# Patient Record
Sex: Female | Born: 1985 | Hispanic: Yes | Smoking: Never smoker
Health system: Southern US, Community
[De-identification: ages and names within clinical notes are randomized; demographics above are authoritative.]

## PROBLEM LIST (undated history)

## (undated) DIAGNOSIS — R51 Headache: Secondary | ICD-10-CM

---

## 2004-10-28 ENCOUNTER — Inpatient Hospital Stay (HOSPITAL_COMMUNITY): Admission: AD | Admit: 2004-10-28 | Discharge: 2004-10-28 | Payer: Self-pay | Admitting: Obstetrics & Gynecology

## 2005-01-01 ENCOUNTER — Inpatient Hospital Stay (HOSPITAL_COMMUNITY): Admission: AD | Admit: 2005-01-01 | Discharge: 2005-01-04 | Payer: Self-pay | Admitting: Obstetrics and Gynecology

## 2006-03-12 IMAGING — US US OB COMP LESS 14 WK
1 series · 14 of 28 positions shown · non-contrast
Comparison: none

CLINICAL DATA: Abnormal vaginal bleeding.   Early pregnancy.
 TRANSABDOMINAL AND TRANSVAGINAL OBSTRETRICAL ULTRASOUND: 
 There is a deformed gestational sac measuring approximately 3 cm in diameter consistent with a gestational age of 8 weeks, 2 days.   There is a tiny yolk sac but no definitive embryo.  Small soft tissue density which could represent an embryo measures 3.7 mm consistent with 6 weeks, 0 days.   There is no cardiac activity however.  There is a small subchorionic hemorrhage.   There is a 1.4 cm hemorrhagic corpus luteum cyst on the otherwise normal right ovary.   Left ovary is normal.  No free fluid.

[Series 1: us ob comp less 14 wk · 0.22mm/px · 14 of 69 slices shown]
[im 3/69]
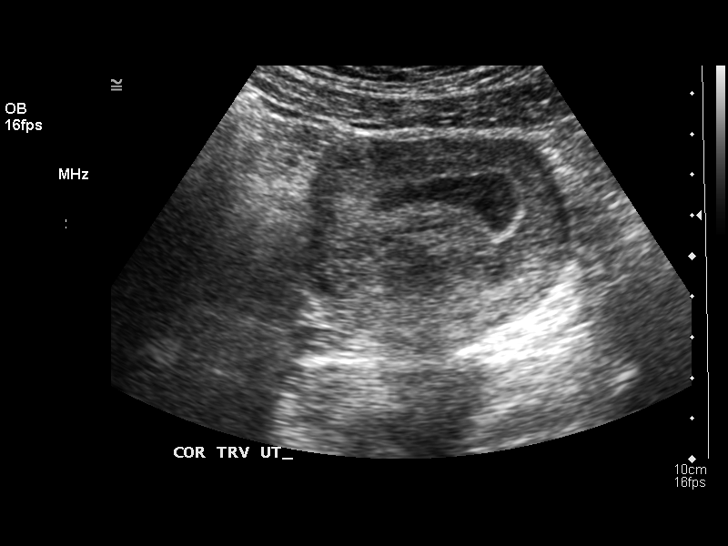
[im 8/69]
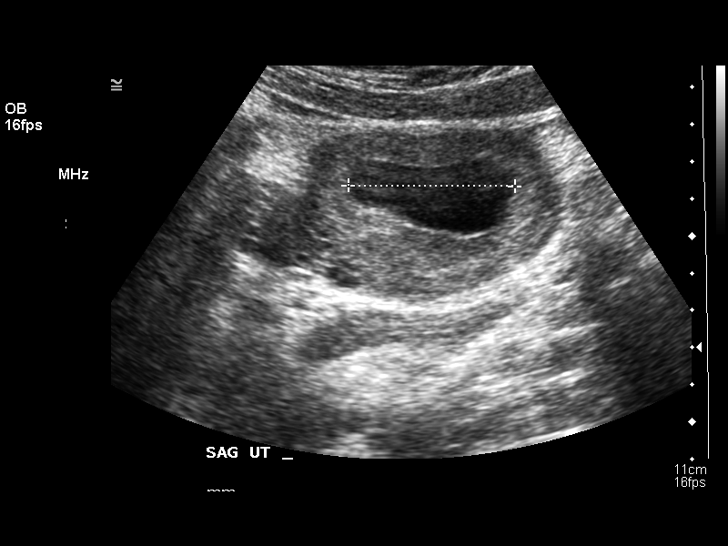
[im 13/69]
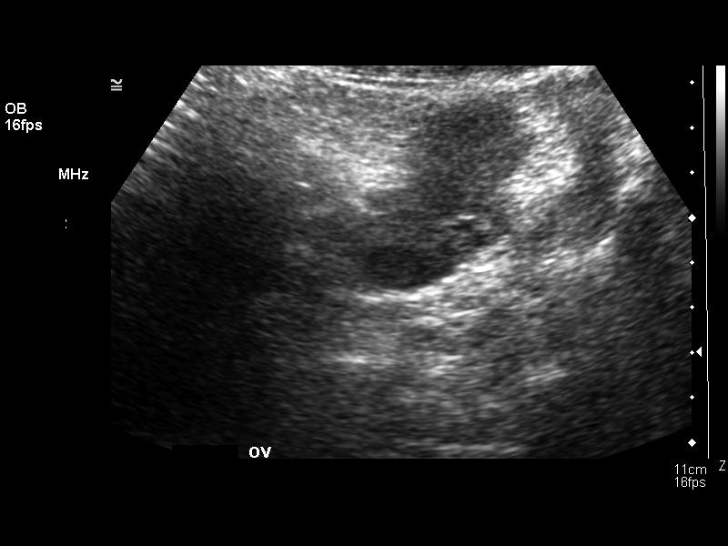
[im 18/69]
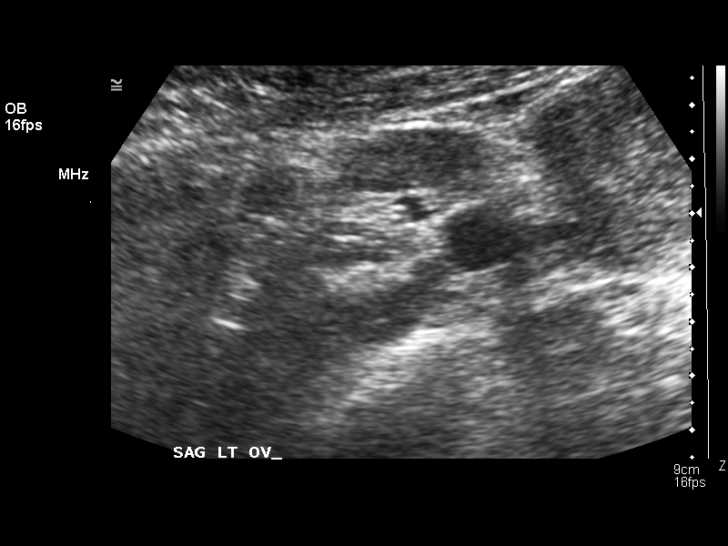
[im 23/69]
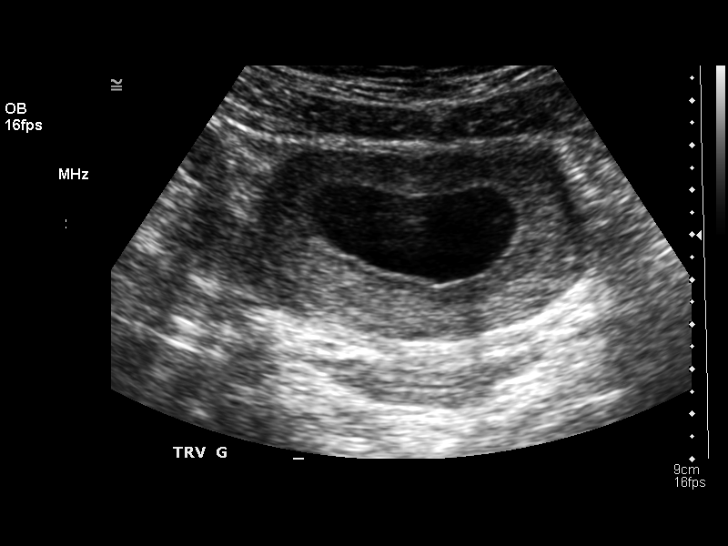
[im 28/69]
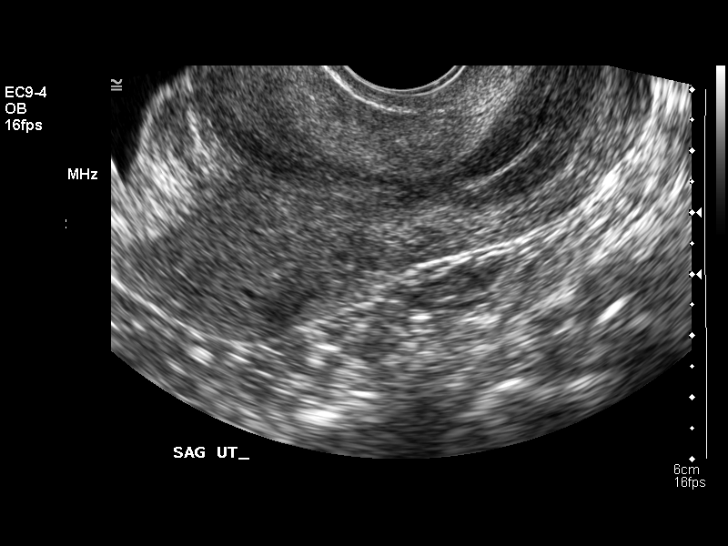
[im 33/69]
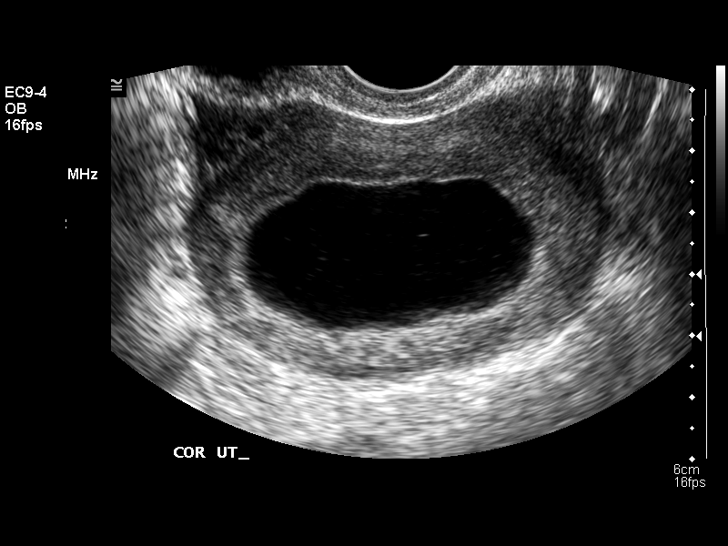
[im 38/69]
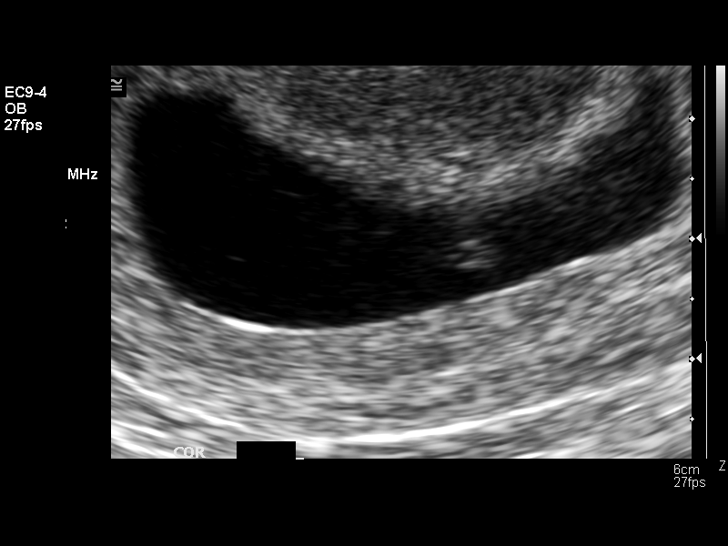
[im 43/69]
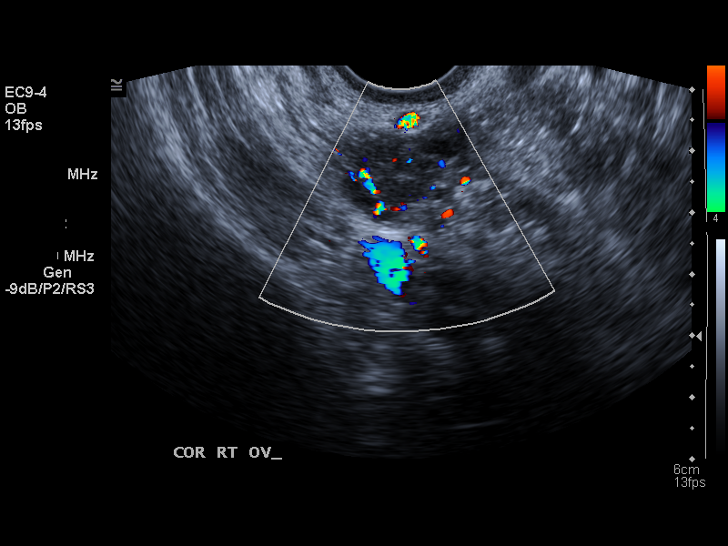
[im 48/69]
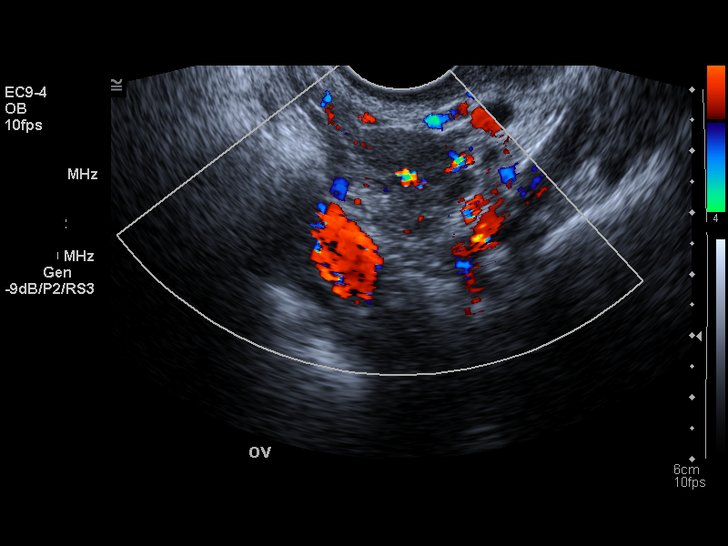
[im 53/69]
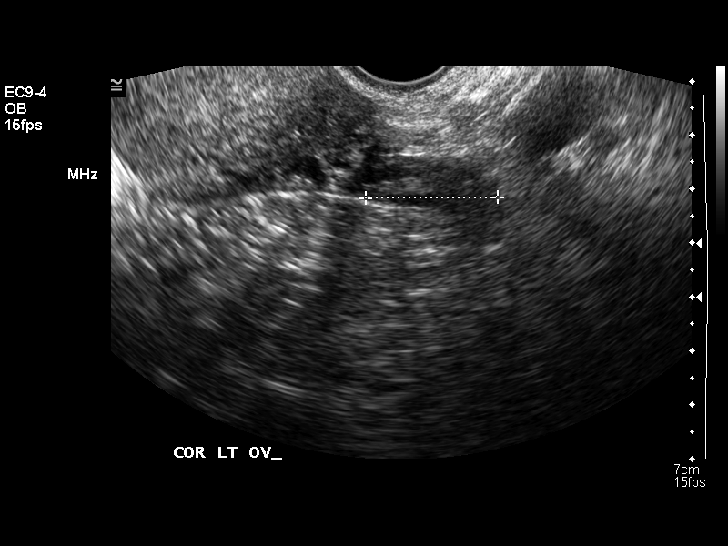
[im 58/69]
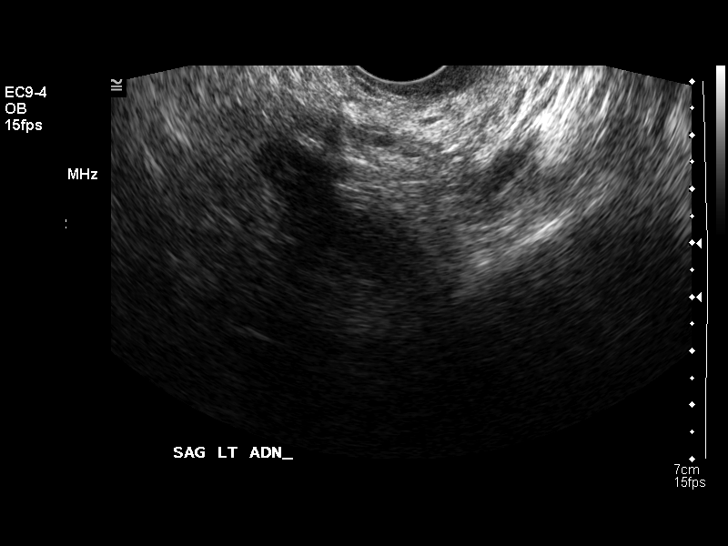
[im 63/69]
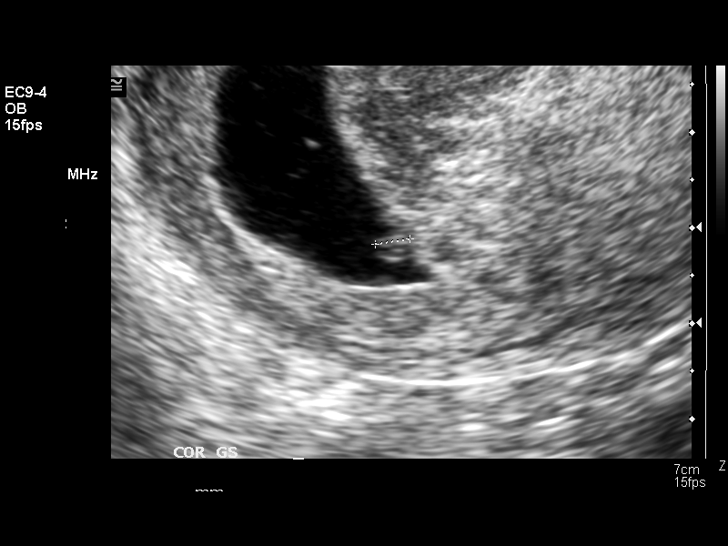
[im 69/69]
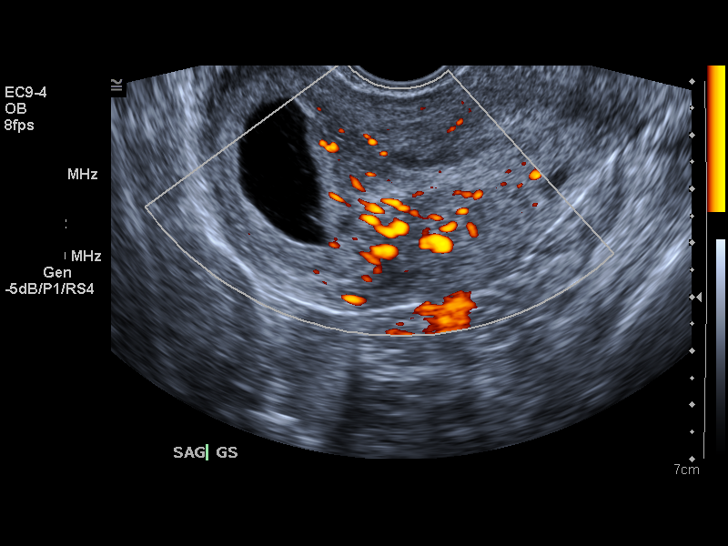

[14 of 28 positions shown; findings below may reference images not displayed]

IMPRESSION: Deformed abnormal gestational sac with no definitive embryo.   No fetal heart beat.  Subchorionic hemorrhage.

## 2006-08-08 ENCOUNTER — Encounter (INDEPENDENT_AMBULATORY_CARE_PROVIDER_SITE_OTHER): Payer: Self-pay | Admitting: Specialist

## 2006-08-08 ENCOUNTER — Inpatient Hospital Stay (HOSPITAL_COMMUNITY): Admission: RE | Admit: 2006-08-08 | Discharge: 2006-08-11 | Payer: Self-pay | Admitting: Obstetrics

## 2007-05-21 ENCOUNTER — Inpatient Hospital Stay (HOSPITAL_COMMUNITY): Admission: AD | Admit: 2007-05-21 | Discharge: 2007-05-21 | Payer: Self-pay | Admitting: Obstetrics & Gynecology

## 2008-07-26 ENCOUNTER — Inpatient Hospital Stay (HOSPITAL_COMMUNITY): Admission: RE | Admit: 2008-07-26 | Discharge: 2008-07-29 | Payer: Self-pay | Admitting: Obstetrics

## 2010-10-23 LAB — CBC
Hemoglobin: 11.7 g/dL — ABNORMAL LOW (ref 12.0–15.0)
MCV: 90.2 fL (ref 78.0–100.0)
Platelets: 152 10*3/uL (ref 150–400)
RBC: 3.22 MIL/uL — ABNORMAL LOW (ref 3.87–5.11)

## 2010-10-23 LAB — RAPID HIV SCREEN (WH-MAU): Rapid HIV Screen: NONREACTIVE

## 2010-10-23 LAB — RPR: RPR Ser Ql: NONREACTIVE

## 2010-11-21 NOTE — Op Note (Signed)
NAME:  Martha Stevens, Martha Stevens NO.:  [id number]   MEDICAL RECORD NO.:  [id number]          PATIENT TYPE:  INP   LOCATION:  9126                          [facility]ILITY:  [facility]   PHYSICIAN:  Kathreen Cosier, M.D.DATE OF BIRTH:  April 23, 1986   DATE OF PROCEDURE:  DATE OF DISCHARGE:                               OPERATIVE REPORT   PREOPERATIVE DIAGNOSIS:  Failure to progress in labor.   POSTOPERATIVE DIAGNOSIS:  Failure to progress in labor.   SURGEON:  Kathreen Cosier, MD   ANESTHESIA:  Epidural.   PROCEDURE:  The patient placed on the operating table in supine  position.  Transverse incision was made through the old scar, carried  down through the rectus fascia.  Fascia cleaned and incised length of  incision.  Recti muscles retracted laterally.  Peritoneum incised  longitudinally.  Transverse incision made at visceral peritoneum above  the bladder.  Bladder mobilized inferiorly.  Transverse lower uterine  incision made.  The patient delivered from the OP position of a female,  Apgar 9 and 10, weighing 8 pounds 2 ounces.  The team was in attendance.  The fluid was clear.  Placenta was posterior, removed manually, and sent  to labor and delivery.  Uterine cavity cleaned and closed in 1 layer  with continuous suture of #1 chromic.  Hemostasis was satisfactory.  Bladder flap reattached with 2-0 chromic.  Uterus well contracted.  Tubes and ovaries normal.  Abdomen closed in layers.  Peritoneum and  fascia continuous suture of 0-Dexon.  Skin closed with subcuticular  stitch of 4-0 Monocryl.  Blood loss 500 mL.           ______________________________  Kathreen Cosier, M.D.     BAM/MEDQ  D:  07/26/2008  T:  07/27/2008  Job:  [id number]

## 2010-11-24 NOTE — H&P (Signed)
NAME:  Martha Stevens, Martha Stevens                ACCOUNT NO.:  [id number]   MEDICAL RECORD NO.:  [id number]          PATIENT TYPE:  INP   LOCATION:  9164                          [facility]   PHYSICIAN:  Kathreen Cosier, M.D.DATE OF BIRTH:  [birth year]   DATE OF ADMISSION:  08/08/2006  DATE OF DISCHARGE:                              HISTORY & PHYSICAL   The patient is a 25 year old gravida 2, para 0-0-1-0, [facility] August 01, 2006 brought in for induction at 41 weeks.  Cervix 1 cm, 70%, vertex -3.  Membranes ruptured and IUPC inserted and the fluid was clear.  She is a  negative GBS.  She was also contracting every 10 minutes.  She was  started on low-dose Pitocin and by 11 a.m. was in good labor.  Cervix 2  cm, 70% vertex, -3.  The patient remained in adequate labor throughout  the day and at 3:50 p.m. was 3+ cm and by 9:30 p.m. there was no change.  The vertex was still -3.  Cervix was 3-4 cm.  It was decided she would  deliver by C-section because of failure to progress in labor, failed  induction.   The estimated fetal weight was 6 pounds 12 ounces.  Extremities  negative.   PHYSICAL EXAMINATION:  Revealed a well-developed female in labor.  HEENT:  Negative.  LUNGS:  Clear.  HEART:  Regular rhythm.  No murmurs, no gallops.  BREASTS:  No masses.  LUNGS:  Clear.  ABDOMEN:  Term estimated fetal weight 6 pounds 12.  PELVIC:  As described above.  EXTREMITIES:  Negative.           ______________________________  Kathreen Cosier, M.D.     BAM/MEDQ  D:  08/08/2006  T:  08/08/2006  Job:  [id number]

## 2010-11-24 NOTE — Op Note (Signed)
NAME:  Martha Stevens, Martha Stevens                ACCOUNT NO.:  [id number]   MEDICAL RECORD NO.:  [id number]          PATIENT TYPE:  INP   LOCATION:  9164                          [facility]   PHYSICIAN:  Kathreen Cosier, M.D.DATE OF BIRTH:  Sep 26, 1985   DATE OF PROCEDURE:  08/08/2006  DATE OF DISCHARGE:                               OPERATIVE REPORT   PREOPERATIVE DIAGNOSIS:  Failure to progress in labor, failed induction.   SURGEON:  Kathreen Cosier, M.D.   DESCRIPTION OF PROCEDURE:  The patient was placed on the operating table  in the supine position after the epidural dosed.  Abdomen prepped and  draped.  The bladder was emptied with a Foley catheter.  Transverse  suprapubic incision made and carried down to the rectus fascia.  Fascia  cleaned and incised the length of the incision.  The rectus muscles  retracted laterally and peritoneum incised longitudinally.  Transverse  incision made in the visceroperitoneum above the bladder and the bladder  mobilized inferiorly.  Transverse lower uterine incision made and the  patient delivered from the OP position of a female, Apgars 8 and 9,  weighing 6 pounds 9 ounces.  Team was in attendance and the nasopharynx  and stomach DeLee'd prior to delivery of shoulders.  The pH was 7.32.  The placenta was anterior, removed manually, and sent to pathology.  Uterine cavity cleaned with dry laps.  The uterine incision closed in  two layers with continuous suture of #1 chromic.  Hemostasis  satisfactory.  Bladder flap reattached with 2-0 chromic.  Uterus was  well contracted.  Tubes and ovaries were normal.  Abdomen closed in  layers.  The peritoneum with continuous suture of 0 chromic, the fascia  with continuous suture of 0 Dexon, skin closed with subcuticular stitch  of 4-0 Monocryl.  Estimated blood loss was 500 mL.  The patient  tolerated the procedure well and was taken to the recovery room in good  condition.     ______________________________  Kathreen Cosier, M.D.     BAM/MEDQ  D:  08/08/2006  T:  08/09/2006  Job:  [id number]

## 2010-11-24 NOTE — Discharge Summary (Signed)
NAME:  Martha Stevens, Martha Stevens                ACCOUNT NO.:  [id number]   MEDICAL RECORD NO.:  [id number]          PATIENT TYPE:  INP   LOCATION:  9145                          [facility]   PHYSICIAN:  Kathreen Cosier, M.D.DATE OF BIRTH:  [birth year]   DATE OF ADMISSION:  08/08/2006  DATE OF DISCHARGE:  08/11/2006                               DISCHARGE SUMMARY   The patient is a 25 year old gravida 2, para 0-0-1-0; [facility] August 01, 2006.  She was brought in for induction at 41 weeks.  Cervix 1 cm, 70%,  vertex, -3.  Membranes were ruptured artificially.  Negative GBS.  IUPC  inserted and started on low-dose Pitocin.  The patient eventually had a  C-section for failure to progress in labor and had a female, Apgars 8 and  9, weighing 6 pounds 9 ounces from the OP position.  Cord pH was 7.32.  There was slight meconium.  Postoperatively, she did well.  On admission  her hemoglobin was 11.7, postoperatively 9.5, platelets 237 and 210.  Remainder of the labs on the prenatal record.  The patient was  discharged on postoperative day #3 on a regular diet, on Tylox for pain,  on ferrous sulfate for anemia.   DISCHARGE DIAGNOSIS:  Status post primary low transverse cesarean  section at 41 weeks because of failed induction.           ______________________________  Kathreen Cosier, M.D.     BAM/MEDQ  D:  08/28/2006  T:  08/28/2006  Job:  [id number]

## 2010-11-24 NOTE — Discharge Summary (Signed)
NAME:  Martha Stevens, Martha Stevens NO.:  [id number]   MEDICAL RECORD NO.:  [id number]          PATIENT TYPE:  [facility]   LOCATION:  [facility]                            [facility]  [facility]   PHYSICIAN:  Juluis Mire, M.D.   DATE OF BIRTH:  May 14, 1986   DATE OF ADMISSION:  01/01/2005  DATE OF DISCHARGE:  01/03/2005                                 DISCHARGE SUMMARY   ADMITTING DIAGNOSIS:  Pyelonephritis.   DISCHARGE DIAGNOSES:  Pyelonephritis.   PROCEDURE:  Intravenous antibiotics.   For complete history and physical, please see written note.   COURSE IN THE HOSPITAL:  The patient was brought in and begun on IV Cipro.  The pregnancy test was negative in terms of labs.  Her GC and Chlamydia were  negative.  A blood culture came back negative.  Supposedly a urine culture  was supposed to have been done, however, the results are still pending at  the present time.  Her white count was 10,400 with a normal differential.  Hemoglobin was 10.6.  She responded nicely to the Cipro.  She only had one  temperature of 101 early in her hospitalization and was afebrile throughout  the day of January 02, 2005 and the evening and on the morning of January 03, 2005.  At that time, her abdomen was soft, nontender.  She had no CVA  tenderness, was feeling much better and tolerating her diet.  She will be  discharged home at that time.  She will be continued on Cipro.   In terms of complications, none encountered during her stay the hospital.  The patient was discharged in stable condition.   DISPOSITION:  Again, will continue on Cipro at home.  I have given her Tylox  as needed for pain.  She should call with increasing pain or fever.  We will  try to arrange followup in the office in one week.       JSM/MEDQ  D:  01/03/2005  T:  01/03/2005  Job:  [id number]

## 2010-11-24 NOTE — Discharge Summary (Signed)
NAME:  GITEL, BESTE NO.:  [id number]   MEDICAL RECORD NO.:  [id number]          PATIENT TYPE:  INP   LOCATION:  9126                          [facility]   PHYSICIAN:  Kathreen Cosier, M.D.DATE OF BIRTH:  Nov 17, 1985   DATE OF ADMISSION:  07/26/2008  DATE OF DISCHARGE:  07/29/2008                               DISCHARGE SUMMARY   The patient is a 25 year old gravida 3, para 1-0-1-1 in for VBAC.  Her  due date was July 22, 2008, and she was 1 cm, 80%, vertex, -3, and  the patient underwent a repeat low transverse cesarean section because  of failure to progress in labor.  She had a female Apgar 9 and 10,  weighing 8 pounds 2 ounces.  Postoperatively, she did well and her  hemoglobin was 9.7.  She was discharged on the third postoperative day,  ambulatory, on a regular diet, to see me in 6 weeks.   DISCHARGE DIAGNOSES:  1. Status post repeat low transverse cesarean section at term.  2. Failure to progress in labor.           ______________________________  Kathreen Cosier, M.D.     BAM/MEDQ  D:  08/25/2008  T:  08/25/2008  Job:  [id number]

## 2010-12-25 LAB — RPR: RPR: NONREACTIVE

## 2010-12-25 LAB — ANTIBODY SCREEN: Antibody Screen: NEGATIVE

## 2011-01-12 ENCOUNTER — Inpatient Hospital Stay (HOSPITAL_COMMUNITY)
Admission: AD | Admit: 2011-01-12 | Discharge: 2011-01-12 | Disposition: A | Discharge status: Home / Self Care | Payer: Self-pay | Source: Ambulatory Visit | Attending: Obstetrics | Admitting: Obstetrics

## 2011-01-12 DIAGNOSIS — O210 Mild hyperemesis gravidarum: Secondary | ICD-10-CM | POA: Insufficient documentation

## 2011-01-12 DIAGNOSIS — O9989 Other specified diseases and conditions complicating pregnancy, childbirth and the puerperium: Secondary | ICD-10-CM

## 2011-01-12 DIAGNOSIS — K5289 Other specified noninfective gastroenteritis and colitis: Secondary | ICD-10-CM | POA: Insufficient documentation

## 2011-01-12 DIAGNOSIS — O99891 Other specified diseases and conditions complicating pregnancy: Secondary | ICD-10-CM | POA: Insufficient documentation

## 2011-01-12 LAB — URINALYSIS, ROUTINE W REFLEX MICROSCOPIC
Bilirubin Urine: NEGATIVE
Glucose, UA: NEGATIVE mg/dL
Ketones, ur: 15 mg/dL — AB
Leukocytes, UA: NEGATIVE
Nitrite: NEGATIVE
pH: 6 (ref 5.0–8.0)

## 2011-04-17 LAB — CBC
MCHC: 34.3
MCV: 90.9
Platelets: 209

## 2011-04-17 LAB — DIFFERENTIAL
Basophils Absolute: 0
Basophils Relative: 1
Eosinophils Absolute: 0.1 — ABNORMAL LOW
Neutrophils Relative %: 48

## 2011-04-17 LAB — URINALYSIS, ROUTINE W REFLEX MICROSCOPIC
Hgb urine dipstick: NEGATIVE
Ketones, ur: NEGATIVE
Nitrite: NEGATIVE
Specific Gravity, Urine: 1.02
Urobilinogen, UA: 0.2

## 2011-04-17 LAB — POCT PREGNANCY, URINE: Operator id: 220991

## 2011-04-17 LAB — GC/CHLAMYDIA PROBE AMP, GENITAL
Chlamydia, DNA Probe: NEGATIVE
GC Probe Amp, Genital: NEGATIVE

## 2011-05-03 ENCOUNTER — Encounter (HOSPITAL_COMMUNITY): Payer: Self-pay | Admitting: *Deleted

## 2011-05-30 ENCOUNTER — Other Ambulatory Visit: Payer: Self-pay | Admitting: Obstetrics

## 2011-06-05 ENCOUNTER — Encounter (HOSPITAL_COMMUNITY): Payer: Self-pay | Admitting: Pharmacist

## 2011-06-13 ENCOUNTER — Encounter (HOSPITAL_COMMUNITY): Payer: Self-pay

## 2011-06-14 ENCOUNTER — Encounter (HOSPITAL_COMMUNITY)
Admission: RE | Admit: 2011-06-14 | Discharge: 2011-06-14 | Disposition: A | Discharge status: Home / Self Care | Payer: Medicaid Other | Source: Ambulatory Visit | Attending: Obstetrics | Admitting: Obstetrics

## 2011-06-14 ENCOUNTER — Encounter (HOSPITAL_COMMUNITY): Payer: Self-pay

## 2011-06-14 LAB — CBC
HCT: 30.8 % — ABNORMAL LOW (ref 36.0–46.0)
Hemoglobin: 9.8 g/dL — ABNORMAL LOW (ref 12.0–15.0)
RBC: 3.6 MIL/uL — ABNORMAL LOW (ref 3.87–5.11)
RDW: 14.4 % (ref 11.5–15.5)
WBC: 8.3 10*3/uL (ref 4.0–10.5)

## 2011-06-14 LAB — RPR: RPR Ser Ql: NONREACTIVE

## 2011-06-14 LAB — SURGICAL PCR SCREEN
MRSA, PCR: NEGATIVE
Staphylococcus aureus: NEGATIVE

## 2011-06-14 NOTE — Patient Instructions (Addendum)
   Your procedure is scheduled on: Tuesday December 11th  Enter through the Main Entrance of [facility] at: [employer] up the phone at the desk and dial [phone] and inform us of your arrival.  Please call this number if you have any problems the morning of surgery: [phone]  Remember: Do not eat food after midnight: Monday Do not drink clear liquids after:midnight Monday Take these medicines the morning of surgery with a SIP OF WATER:none  Do not wear jewelry, make-up, or FINGER nail polish Do not wear lotions, powders, perfumes or deodorant. Do not shave 48 hours prior to surgery. Do not bring valuables to the hospital.  Leave suitcase in the car. After Surgery it may be brought to your room. For patients being admitted to the hospital, checkout time is 11:00am the day of discharge.  Remember to use your hibiclens as instructed.Please shower with 1/2 bottle the evening before your surgery and the other 1/2 bottle the morning of surgery.

## 2011-06-17 NOTE — H&P (Unsigned)
NAME:  OMER, MONTER NO.:  [id number]  MEDICAL RECORD NO.:  [id number]  LOCATION:  PERIO                         [facility]  [facility]  PHYSICIAN:  Kathreen Cosier, M.D.DATE OF BIRTH:  [birth year]  DATE OF ADMISSION:  05/30/2011 DATE OF DISCHARGE:                             HISTORY & PHYSICAL   DATE OF OPERATION:  June 19, 2011.  HISTORY OF PRESENT ILLNESS:  The patient is a 25 year old, gravida 4, para 2-0-1-2, [facility] June 23, 2011, negative GBS with 2 previous C- sections and is now at term and is in for repeat C-section.  PAST MEDICAL HISTORY:  Negative.  PAST SURGICAL HISTORY:  C-section x2.  SOCIAL HISTORY:  Negative.  REVIEW OF SYSTEMS:  Negative.  PHYSICAL EXAMINATION:  GENERAL:  A well-developed female in no distress. HEENT:  Negative. LUNGS:  Clear. HEART:  Regular rhythm.  No murmurs, no gallops. ABDOMEN:  Term. PELVIC:  Cervix long, closed. EXTREMITIES:  Negative.          ______________________________ Kathreen Cosier, M.D.     BAM/MEDQ  D:  06/17/2011  T:  06/17/2011  Job:  [id number]

## 2011-06-19 ENCOUNTER — Inpatient Hospital Stay (HOSPITAL_COMMUNITY)
Admission: RE | Admit: 2011-06-19 | Discharge: 2011-06-22 | DRG: 766 | Disposition: A | Discharge status: Home / Self Care | Payer: Medicaid Other | Source: Ambulatory Visit | Attending: Obstetrics | Admitting: Obstetrics

## 2011-06-19 ENCOUNTER — Inpatient Hospital Stay (HOSPITAL_COMMUNITY): Payer: Medicaid Other | Admitting: Anesthesiology

## 2011-06-19 ENCOUNTER — Encounter (HOSPITAL_COMMUNITY): Payer: Self-pay | Admitting: *Deleted

## 2011-06-19 ENCOUNTER — Encounter (HOSPITAL_COMMUNITY)
Admission: RE | Disposition: A | Discharge status: Home / Self Care | Payer: Self-pay | Source: Ambulatory Visit | Attending: Obstetrics

## 2011-06-19 ENCOUNTER — Encounter (HOSPITAL_COMMUNITY): Payer: Self-pay | Admitting: Anesthesiology

## 2011-06-19 DIAGNOSIS — Z01812 Encounter for preprocedural laboratory examination: Secondary | ICD-10-CM

## 2011-06-19 DIAGNOSIS — O34219 Maternal care for unspecified type scar from previous cesarean delivery: Principal | ICD-10-CM | POA: Diagnosis present

## 2011-06-19 DIAGNOSIS — Z01818 Encounter for other preprocedural examination: Secondary | ICD-10-CM

## 2011-06-19 DIAGNOSIS — Z98891 History of uterine scar from previous surgery: Secondary | ICD-10-CM

## 2011-06-19 LAB — TYPE AND SCREEN: Antibody Screen: NEGATIVE

## 2011-06-19 LAB — RAPID HIV SCREEN (WH-MAU): Rapid HIV Screen: NONREACTIVE

## 2011-06-19 SURGERY — Surgical Case
Anesthesia: Spinal

## 2011-06-19 MED ORDER — NALBUPHINE HCL 10 MG/ML IJ SOLN
5.0000 mg | INTRAMUSCULAR | Status: DC | PRN
  Administered 2011-06-19: 5 mg via INTRAVENOUS
  Administered 2011-06-19: 10 mg via INTRAVENOUS
  Administered 2011-06-19: 5 mg via INTRAVENOUS
  Filled 2011-06-19 (×3): qty 1

## 2011-06-19 MED ORDER — NALOXONE HCL 0.4 MG/ML IJ SOLN: 0.4000 mg | INTRAMUSCULAR | Status: DC | PRN

## 2011-06-19 MED ORDER — FENTANYL CITRATE 0.05 MG/ML IJ SOLN
INTRAMUSCULAR | Status: DC | PRN
  Administered 2011-06-19: 25 ug via INTRATHECAL

## 2011-06-19 MED ORDER — MEPERIDINE HCL 25 MG/ML IJ SOLN: 6.2500 mg | INTRAMUSCULAR | Status: DC | PRN

## 2011-06-19 MED ORDER — PHENYLEPHRINE 40 MCG/ML (10ML) SYRINGE FOR IV PUSH (FOR BLOOD PRESSURE SUPPORT)
PREFILLED_SYRINGE | INTRAVENOUS | Status: AC
  Filled 2011-06-19: qty 15

## 2011-06-19 MED ORDER — SIMETHICONE 80 MG PO CHEW: 80.0000 mg | CHEWABLE_TABLET | ORAL | Status: DC | PRN

## 2011-06-19 MED ORDER — KETOROLAC TROMETHAMINE 30 MG/ML IJ SOLN
INTRAMUSCULAR | Status: AC
  Administered 2011-06-19: 30 mg via INTRAMUSCULAR
  Filled 2011-06-19: qty 1

## 2011-06-19 MED ORDER — IBUPROFEN 600 MG PO TABS
600.0000 mg | ORAL_TABLET | Freq: Four times a day (QID) | ORAL | Status: DC
  Administered 2011-06-19 – 2011-06-22 (×10): 600 mg via ORAL
  Filled 2011-06-19 (×10): qty 1

## 2011-06-19 MED ORDER — KETOROLAC TROMETHAMINE 30 MG/ML IJ SOLN
30.0000 mg | Freq: Four times a day (QID) | INTRAMUSCULAR | Status: AC | PRN
  Administered 2011-06-19: 30 mg via INTRAMUSCULAR

## 2011-06-19 MED ORDER — FENTANYL CITRATE 0.05 MG/ML IJ SOLN: 25.0000 ug | INTRAMUSCULAR | Status: DC | PRN

## 2011-06-19 MED ORDER — ONDANSETRON HCL 4 MG/2ML IJ SOLN: 4.0000 mg | Freq: Three times a day (TID) | INTRAMUSCULAR | Status: DC | PRN

## 2011-06-19 MED ORDER — TETANUS-DIPHTH-ACELL PERTUSSIS 5-2.5-18.5 LF-MCG/0.5 IM SUSP: 0.5000 mL | Freq: Once | INTRAMUSCULAR | Status: DC

## 2011-06-19 MED ORDER — EPHEDRINE 5 MG/ML INJ
INTRAVENOUS | Status: AC
  Filled 2011-06-19: qty 10

## 2011-06-19 MED ORDER — SIMETHICONE 80 MG PO CHEW
80.0000 mg | CHEWABLE_TABLET | Freq: Three times a day (TID) | ORAL | Status: DC
  Administered 2011-06-19 – 2011-06-22 (×10): 80 mg via ORAL

## 2011-06-19 MED ORDER — LANOLIN HYDROUS EX OINT: 1.0000 "application " | TOPICAL_OINTMENT | CUTANEOUS | Status: DC | PRN

## 2011-06-19 MED ORDER — IBUPROFEN 600 MG PO TABS: 600.0000 mg | ORAL_TABLET | Freq: Four times a day (QID) | ORAL | Status: DC | PRN

## 2011-06-19 MED ORDER — BUPIVACAINE HCL (PF) 0.25 % IJ SOLN: INTRAMUSCULAR | Status: DC | PRN

## 2011-06-19 MED ORDER — NALBUPHINE HCL 10 MG/ML IJ SOLN
5.0000 mg | INTRAMUSCULAR | Status: DC | PRN
  Administered 2011-06-19: 10 mg via SUBCUTANEOUS
  Filled 2011-06-19: qty 1

## 2011-06-19 MED ORDER — FENTANYL CITRATE 0.05 MG/ML IJ SOLN
INTRAMUSCULAR | Status: AC
  Filled 2011-06-19: qty 2

## 2011-06-19 MED ORDER — ACETAMINOPHEN 10 MG/ML IV SOLN
1000.0000 mg | Freq: Four times a day (QID) | INTRAVENOUS | Status: AC | PRN
  Filled 2011-06-19: qty 100

## 2011-06-19 MED ORDER — EPHEDRINE SULFATE 50 MG/ML IJ SOLN
INTRAMUSCULAR | Status: DC | PRN
  Administered 2011-06-19: 10 mg via INTRAVENOUS

## 2011-06-19 MED ORDER — OXYTOCIN 20 UNITS IN LACTATED RINGERS INFUSION - SIMPLE
INTRAVENOUS | Status: DC | PRN
  Administered 2011-06-19 (×2): 20 [IU] via INTRAVENOUS

## 2011-06-19 MED ORDER — ONDANSETRON HCL 4 MG/2ML IJ SOLN
INTRAMUSCULAR | Status: DC | PRN
  Administered 2011-06-19: 4 mg via INTRAVENOUS

## 2011-06-19 MED ORDER — PROMETHAZINE HCL 25 MG/ML IJ SOLN: 6.2500 mg | INTRAMUSCULAR | Status: DC | PRN

## 2011-06-19 MED ORDER — ACETAMINOPHEN 325 MG PO TABS: 325.0000 mg | ORAL_TABLET | ORAL | Status: DC | PRN

## 2011-06-19 MED ORDER — DIPHENHYDRAMINE HCL 50 MG/ML IJ SOLN: 25.0000 mg | INTRAMUSCULAR | Status: DC | PRN

## 2011-06-19 MED ORDER — DIPHENHYDRAMINE HCL 50 MG/ML IJ SOLN
12.5000 mg | INTRAMUSCULAR | Status: DC | PRN
  Administered 2011-06-19: 12.5 mg via INTRAVENOUS
  Filled 2011-06-19: qty 1

## 2011-06-19 MED ORDER — DIPHENHYDRAMINE HCL 25 MG PO CAPS: 25.0000 mg | ORAL_CAPSULE | Freq: Four times a day (QID) | ORAL | Status: DC | PRN

## 2011-06-19 MED ORDER — ZOLPIDEM TARTRATE 5 MG PO TABS: 5.0000 mg | ORAL_TABLET | Freq: Every evening | ORAL | Status: DC | PRN

## 2011-06-19 MED ORDER — OXYCODONE-ACETAMINOPHEN 5-325 MG PO TABS
1.0000 | ORAL_TABLET | ORAL | Status: DC | PRN
  Administered 2011-06-20 – 2011-06-21 (×7): 1 via ORAL
  Administered 2011-06-22: 2 via ORAL
  Filled 2011-06-19 (×4): qty 1
  Filled 2011-06-19: qty 2
  Filled 2011-06-19 (×3): qty 1

## 2011-06-19 MED ORDER — MENTHOL 3 MG MT LOZG: 1.0000 | LOZENGE | OROMUCOSAL | Status: DC | PRN

## 2011-06-19 MED ORDER — KETOROLAC TROMETHAMINE 30 MG/ML IJ SOLN
30.0000 mg | Freq: Four times a day (QID) | INTRAMUSCULAR | Status: AC | PRN
  Administered 2011-06-19: 30 mg via INTRAVENOUS
  Filled 2011-06-19: qty 1

## 2011-06-19 MED ORDER — LACTATED RINGERS IV SOLN
INTRAVENOUS | Status: DC
  Administered 2011-06-19: 15:00:00 via INTRAVENOUS

## 2011-06-19 MED ORDER — CEFAZOLIN SODIUM 1-5 GM-% IV SOLN
INTRAVENOUS | Status: AC
  Administered 2011-06-19: 1 g via INTRAVENOUS
  Filled 2011-06-19: qty 50

## 2011-06-19 MED ORDER — WITCH HAZEL-GLYCERIN EX PADS: 1.0000 "application " | MEDICATED_PAD | CUTANEOUS | Status: DC | PRN

## 2011-06-19 MED ORDER — PRENATAL PLUS 27-1 MG PO TABS
1.0000 | ORAL_TABLET | Freq: Every day | ORAL | Status: DC
  Administered 2011-06-20 – 2011-06-22 (×3): 1 via ORAL
  Filled 2011-06-19 (×3): qty 1

## 2011-06-19 MED ORDER — DIPHENHYDRAMINE HCL 25 MG PO CAPS
25.0000 mg | ORAL_CAPSULE | ORAL | Status: DC | PRN
  Filled 2011-06-19: qty 1

## 2011-06-19 MED ORDER — ONDANSETRON HCL 4 MG PO TABS: 4.0000 mg | ORAL_TABLET | ORAL | Status: DC | PRN

## 2011-06-19 MED ORDER — METOCLOPRAMIDE HCL 5 MG/ML IJ SOLN: 10.0000 mg | Freq: Three times a day (TID) | INTRAMUSCULAR | Status: DC | PRN

## 2011-06-19 MED ORDER — MORPHINE SULFATE (PF) 0.5 MG/ML IJ SOLN
INTRAMUSCULAR | Status: DC | PRN
  Administered 2011-06-19: .15 mg via INTRATHECAL

## 2011-06-19 MED ORDER — ONDANSETRON HCL 4 MG/2ML IJ SOLN: 4.0000 mg | INTRAMUSCULAR | Status: DC | PRN

## 2011-06-19 MED ORDER — PHENYLEPHRINE 40 MCG/ML (10ML) SYRINGE FOR IV PUSH (FOR BLOOD PRESSURE SUPPORT)
PREFILLED_SYRINGE | INTRAVENOUS | Status: AC
  Filled 2011-06-19: qty 5

## 2011-06-19 MED ORDER — SENNOSIDES-DOCUSATE SODIUM 8.6-50 MG PO TABS
2.0000 | ORAL_TABLET | Freq: Every day | ORAL | Status: DC
  Administered 2011-06-19 – 2011-06-21 (×3): 2 via ORAL

## 2011-06-19 MED ORDER — SCOPOLAMINE 1 MG/3DAYS TD PT72: 1.0000 | MEDICATED_PATCH | Freq: Once | TRANSDERMAL | Status: DC

## 2011-06-19 MED ORDER — LACTATED RINGERS IV SOLN
INTRAVENOUS | Status: DC
  Administered 2011-06-19 (×5): via INTRAVENOUS

## 2011-06-19 MED ORDER — MORPHINE SULFATE 0.5 MG/ML IJ SOLN
INTRAMUSCULAR | Status: AC
  Filled 2011-06-19: qty 10

## 2011-06-19 MED ORDER — OXYTOCIN 20 UNITS IN LACTATED RINGERS INFUSION - SIMPLE: 125.0000 mL/h | INTRAVENOUS | Status: DC

## 2011-06-19 MED ORDER — DIBUCAINE 1 % RE OINT: 1.0000 "application " | TOPICAL_OINTMENT | RECTAL | Status: DC | PRN

## 2011-06-19 MED ORDER — OXYTOCIN 10 UNIT/ML IJ SOLN
INTRAMUSCULAR | Status: AC
  Filled 2011-06-19: qty 4

## 2011-06-19 MED ORDER — SODIUM CHLORIDE 0.9 % IV SOLN: 1.0000 ug/kg/h | INTRAVENOUS | Status: DC | PRN

## 2011-06-19 MED ORDER — SCOPOLAMINE 1 MG/3DAYS TD PT72
MEDICATED_PATCH | TRANSDERMAL | Status: AC
  Administered 2011-06-19: 1.5 mg
  Filled 2011-06-19: qty 1

## 2011-06-19 MED ORDER — PHENYLEPHRINE HCL 10 MG/ML IJ SOLN
INTRAMUSCULAR | Status: DC | PRN
  Administered 2011-06-19 (×2): 80 ug via INTRAVENOUS
  Administered 2011-06-19: 40 ug via INTRAVENOUS

## 2011-06-19 MED ORDER — SODIUM CHLORIDE 0.9 % IJ SOLN: 3.0000 mL | INTRAMUSCULAR | Status: DC | PRN

## 2011-06-19 SURGICAL SUPPLY — 29 items
ADH SKN CLS APL DERMABOND .7 (GAUZE/BANDAGES/DRESSINGS) ×2
CHLORAPREP W/TINT 26ML (MISCELLANEOUS) ×2 IMPLANT
CLOTH BEACON ORANGE TIMEOUT ST (SAFETY) ×2 IMPLANT
DERMABOND ADVANCED (GAUZE/BANDAGES/DRESSINGS) ×2
DERMABOND ADVANCED .7 DNX12 (GAUZE/BANDAGES/DRESSINGS) ×1 IMPLANT
ELECT REM PT RETURN 9FT ADLT (ELECTROSURGICAL) ×2
ELECTRODE REM PT RTRN 9FT ADLT (ELECTROSURGICAL) ×1 IMPLANT
EXTRACTOR VACUUM M CUP 4 TUBE (SUCTIONS) IMPLANT
GLOVE BIO SURGEON STRL SZ8.5 (GLOVE) ×4 IMPLANT
GOWN PREVENTION PLUS LG XLONG (DISPOSABLE) ×4 IMPLANT
GOWN PREVENTION PLUS XXLARGE (GOWN DISPOSABLE) ×2 IMPLANT
KIT ABG SYR 3ML LUER SLIP (SYRINGE) IMPLANT
NDL HYPO 25X5/8 SAFETYGLIDE (NEEDLE) ×1 IMPLANT
NEEDLE HYPO 25X5/8 SAFETYGLIDE (NEEDLE) ×2 IMPLANT
NS IRRIG 1000ML POUR BTL (IV SOLUTION) ×2 IMPLANT
PACK C SECTION WH (CUSTOM PROCEDURE TRAY) ×2 IMPLANT
SLEEVE SCD COMPRESS KNEE MED (MISCELLANEOUS) IMPLANT
SUT CHROMIC 0 CT 802H (SUTURE) ×2 IMPLANT
SUT CHROMIC 1 CTX 36 (SUTURE) ×4 IMPLANT
SUT CHROMIC 2 0 CT 36 (SUTURE) ×1 IMPLANT
SUT GUT PLAIN 0 CT-3 TAN 27 (SUTURE) IMPLANT
SUT MON AB 4-0 PS1 27 (SUTURE) ×2 IMPLANT
SUT VIC AB 0 CT1 18XCR BRD8 (SUTURE) IMPLANT
SUT VIC AB 0 CT1 8-18 (SUTURE)
SUT VIC AB 0 CTX 36 (SUTURE) ×4
SUT VIC AB 0 CTX36XBRD ANBCTRL (SUTURE) ×2 IMPLANT
TOWEL OR 17X24 6PK STRL BLUE (TOWEL DISPOSABLE) ×4 IMPLANT
TRAY FOLEY CATH 14FR (SET/KITS/TRAYS/PACK) ×2 IMPLANT
WATER STERILE IRR 1000ML POUR (IV SOLUTION) ×2 IMPLANT

## 2011-06-19 NOTE — Op Note (Signed)
preop diagnosis previous cesarean section at term x2 Postop diagnosis repeat C-section at term  first assistant Dr. Coral Ceo Surgeon Dr. Francoise Ceo Anesthesia spinal Procedure patient placed on the operating table in supine position after the spinal administered abdomen prepped and draped bladder and to the Foley catheter a transverse suprapubic incision made with old scar carried him to the rectus fascia fascia cleaned and incised and and to the incision recti muscles retracted laterally peritoneum incised longitudinally transverse incision made in the visceroperitoneum peritoneum above the bladder bladder mobilized inferiorly transverse low uterine incision made the fluid was clear patient delivered from the LOA position of a female Apgar 8 and 9 weighing 8 lbs. 5 oz. The placenta was posterior removed manually and sent to labor and delivery uterine cavity clean dry laps uterine incision closed in one layer with continuous within normal on chromic hemostasis satisfactory bladder flap reattached to a chromic uterus well contracted tubes and ovaries normal abdomen chosen as peritoneum continuous with 2-0 chromic fascia contiguous with 0 Dexon and the skin shows a subcuticular stitch of 4-0 Monocryl blood loss was 700 cc patient tolerated the procedure well

## 2011-06-19 NOTE — Anesthesia Procedure Notes (Signed)

## 2011-06-19 NOTE — H&P (Signed)
Patient history has not changed and examination today is him no different than it was 2 days ago

## 2011-06-19 NOTE — Anesthesia Postprocedure Evaluation (Signed)
  Anesthesia Post-op Note  Patient: Martha Stevens  Procedure(s) Performed:  CESAREAN SECTION  Patient is awake, responsive, moving her legs, and has signs of resolution of her numbness. Pain and nausea are reasonably well controlled. Vital signs are stable and clinically acceptable. Oxygen saturation is clinically acceptable. There are no apparent anesthetic complications at this time. Patient is ready for discharge.

## 2011-06-19 NOTE — Transfer of Care (Signed)
Immediate Anesthesia Transfer of Care Note  Patient: Martha Stevens  Procedure(s) Performed:  CESAREAN SECTION  Patient Location: PACU  Anesthesia Type: Spinal  Level of Consciousness: awake, alert  and oriented  Airway & Oxygen Therapy: Patient Spontanous Breathing  Post-op Assessment: Report given to PACU RN and Post -op Vital signs reviewed and stable  Post vital signs: Reviewed and stable  Complications: No apparent anesthesia complications

## 2011-06-19 NOTE — Anesthesia Preprocedure Evaluation (Signed)
Anesthesia Evaluation  Patient identified by MRN, date of birth, ID band Patient awake    Reviewed: Allergy & Precautions, H&P , NPO status , Patient's Chart, lab work & pertinent test results  Airway Mallampati: III      Dental No notable dental hx.    Pulmonary neg pulmonary ROS,  clear to auscultation  Pulmonary exam normal       Cardiovascular Exercise Tolerance: Good neg cardio ROS regular Normal    Neuro/Psych Negative Neurological ROS  Negative Psych ROS   GI/Hepatic negative GI ROS, Neg liver ROS,   Endo/Other  Negative Endocrine ROSMorbid obesity  Renal/GU negative Renal ROS  Genitourinary negative   Musculoskeletal   Abdominal Normal abdominal exam  (+)   Peds  Hematology negative hematology ROS (+)   Anesthesia Other Findings   Reproductive/Obstetrics (+) Pregnancy                           Anesthesia Physical Anesthesia Plan  ASA: III  Anesthesia Plan: Spinal   Post-op Pain Management:    Induction:   Airway Management Planned:   Additional Equipment:   Intra-op Plan:   Post-operative Plan:   Informed Consent: I have reviewed the patients History and Physical, chart, labs and discussed the procedure including the risks, benefits and alternatives for the proposed anesthesia with the patient or authorized representative who has indicated his/her understanding and acceptance.     Plan Discussed with: Anesthesiologist, CRNA and Surgeon  Anesthesia Plan Comments:         Anesthesia Quick Evaluation

## 2011-06-19 NOTE — Anesthesia Postprocedure Evaluation (Signed)
  Anesthesia Post-op Note  Patient: Martha Stevens  Procedure(s) Performed:  CESAREAN SECTION  Patient Location: 109  Anesthesia Type: Spinal  Level of Consciousness: awake, alert  and oriented  Airway and Oxygen Therapy: Patient Spontanous Breathing  Post-op Pain: mild  Post-op Assessment: Post-op Vital signs reviewed and Patient's Cardiovascular Status Stable  Post-op Vital Signs: Reviewed and stable  Complications: No apparent anesthesia complications

## 2011-06-20 ENCOUNTER — Encounter (HOSPITAL_COMMUNITY): Payer: Self-pay | Admitting: Obstetrics

## 2011-06-20 LAB — CBC
HCT: 25 % — ABNORMAL LOW (ref 36.0–46.0)
MCV: 85.3 fL (ref 78.0–100.0)
Platelets: 155 10*3/uL (ref 150–400)
RBC: 2.93 MIL/uL — ABNORMAL LOW (ref 3.87–5.11)
RDW: 14.6 % (ref 11.5–15.5)
WBC: 9 10*3/uL (ref 4.0–10.5)

## 2011-06-20 NOTE — Progress Notes (Signed)
Patient ID: Martha Stevens, female   DOB: Jul 28, 1985, 25 y.o.   MRN: [id number] Postoperative day #1 Vital signs normal Fundus firm Lochia moderate Legs negative No complaints

## 2011-06-20 NOTE — Progress Notes (Signed)
UR chart review completed.  

## 2011-06-21 NOTE — Progress Notes (Signed)
Patient ID: Martha Stevens, female   DOB: 29-Aug-1985, 25 y.o.   MRN: [id number] And and postop day 2 Vital signs normal Fundus firm Lochia moderate

## 2011-06-22 MED ORDER — TETANUS-DIPHTH-ACELL PERTUSSIS 5-2.5-18.5 LF-MCG/0.5 IM SUSP
0.5000 mL | Freq: Once | INTRAMUSCULAR | Status: AC
  Administered 2011-06-22: 0.5 mL via INTRAMUSCULAR

## 2011-06-22 MED ORDER — INFLUENZA VIRUS VACC SPLIT PF IM SUSP
0.5000 mL | Freq: Once | INTRAMUSCULAR | Status: AC
  Administered 2011-06-22: 0.5 mL via INTRAMUSCULAR
  Filled 2011-06-22: qty 0.5

## 2011-06-22 MED ORDER — OXYCODONE-ACETAMINOPHEN 5-325 MG PO TABS: 1.0000 | ORAL_TABLET | ORAL | Status: AC | PRN

## 2011-06-22 NOTE — Discharge Summary (Signed)
Obstetric Discharge Summary Reason for Admission: cesarean section Prenatal Procedures: none Intrapartum Procedures: cesarean: low cervical, transverse Postpartum Procedures: none Complications-Operative and Postpartum: none Hemoglobin  Date Value Range Status  06/20/2011 7.9* 12.0-15.0 (g/dL) Final     HCT  Date Value Range Status  06/20/2011 25.0* 36.0-46.0 (%) Final    Discharge Diagnoses: Term Pregnancy-delivered  Discharge Information: Date: 06/22/2011 Activity: pelvic rest Diet: routine Medications: Percocet Condition: stable Instructions: refer to practice specific booklet Discharge to: home Follow-up Information    Follow up with MARSHALL,BERNARD A, MD. Call in 6 weeks.   Contact information:   [address] [place] [address] [phone]          Newborn Data: Live born female  Birth Weight: 8 lb 5.5 oz (3785 g) APGAR: 8, 9  Home with mother.  MARSHALL,BERNARD A 06/22/2011, 5:51 AM

## 2011-06-22 NOTE — Progress Notes (Signed)
Dr. Gaynell Face notified that patient complain about being dizzy and nauseated. Patient vitals were the following 110/68 pule 74. No new orders were given. Ok to discharge for now. Rn will monitor.

## 2012-08-11 ENCOUNTER — Emergency Department (HOSPITAL_COMMUNITY)
Admission: EM | Admit: 2012-08-11 | Discharge: 2012-08-12 | Disposition: A | Discharge status: Home / Self Care | Payer: Self-pay | Attending: Emergency Medicine | Admitting: Emergency Medicine

## 2012-08-11 ENCOUNTER — Encounter (HOSPITAL_COMMUNITY): Payer: Self-pay | Admitting: Emergency Medicine

## 2012-08-11 DIAGNOSIS — G43909 Migraine, unspecified, not intractable, without status migrainosus: Secondary | ICD-10-CM | POA: Insufficient documentation

## 2012-08-11 DIAGNOSIS — R51 Headache: Secondary | ICD-10-CM | POA: Insufficient documentation

## 2012-08-11 DIAGNOSIS — R519 Headache, unspecified: Secondary | ICD-10-CM | POA: Insufficient documentation

## 2012-08-11 HISTORY — DX: Headache: R51

## 2012-08-11 MED ORDER — KETOROLAC TROMETHAMINE 60 MG/2ML IM SOLN
60.0000 mg | Freq: Once | INTRAMUSCULAR | Status: AC
  Administered 2012-08-12: 60 mg via INTRAMUSCULAR
  Filled 2012-08-11: qty 2

## 2012-08-11 MED ORDER — DEXAMETHASONE SODIUM PHOSPHATE 10 MG/ML IJ SOLN
10.0000 mg | Freq: Once | INTRAMUSCULAR | Status: AC
  Administered 2012-08-12: 10 mg via INTRAMUSCULAR
  Filled 2012-08-11: qty 1

## 2012-08-11 NOTE — ED Notes (Signed)
Patient complaining of migraine; states that she was diagnosed with chronic headaches one year ago; recent episode of migraines/headaches has been going on for about a week.  Patient reports lightheadedness/dizziness; denies light sensitivity.  Denies nausea and vomiting.

## 2012-08-11 NOTE — ED Provider Notes (Signed)
History     CSN: [id number]  Arrival date & time 08/11/12  2024   First MD Initiated Contact with Patient 08/11/12 2324      Chief Complaint  Patient presents with  . Migraine    (Consider location/radiation/quality/duration/timing/severity/associated sxs/prior treatment) Patient is a 27 y.o. female presenting with migraines. The history is provided by the patient. No language interpreter was used.  Migraine This is a chronic problem. The current episode started more than 1 week ago. The problem occurs constantly. The problem has been gradually worsening. Associated symptoms include headaches. Pertinent negatives include no chest pain, no abdominal pain and no shortness of breath. Nothing aggravates the symptoms. Nothing relieves the symptoms. She has tried ASA for the symptoms. The treatment provided mild relief.  No f/c/r.  No changes in cognition.  No neck pain or stiffness.    Past Medical History  Diagnosis Date  . Headache     Past Surgical History  Procedure Date  . Cesarean section 2008,2010  . Cesarean section 06/19/2011    Procedure: CESAREAN SECTION;  Surgeon: Kathreen Cosier, MD;  Location: [facility];  Service: Gynecology;  Laterality: N/A;    History reviewed. No pertinent family history.  History  Substance Use Topics  . Smoking status: Never Smoker   . Smokeless tobacco: Not on file  . Alcohol Use: Yes     Comment: Occassional Use    OB History    Grav Para Term Preterm Abortions TAB SAB Ect Mult Living   4 3 3  1     3       Review of Systems  Respiratory: Negative for shortness of breath.   Cardiovascular: Negative for chest pain.  Gastrointestinal: Negative for abdominal pain.  Neurological: Positive for headaches. Negative for facial asymmetry, speech difficulty, weakness and numbness.  All other systems reviewed and are negative.    Allergies  Review of patient's allergies indicates no known allergies.  Home Medications  No current  outpatient prescriptions on file.  BP 114/66  Pulse 68  Temp 98.2 F (36.8 C) (Oral)  Resp 16  SpO2 98%  Physical Exam  Constitutional: She is oriented to person, place, and time. She appears well-developed and well-nourished. No distress.  HENT:  Head: Normocephalic and atraumatic.  Right Ear: External ear normal. No mastoid tenderness. Tympanic membrane is not injected.  Left Ear: External ear normal. No mastoid tenderness. Tympanic membrane is not injected.  Mouth/Throat: No oropharyngeal exudate.  Eyes: Conjunctivae normal and EOM are normal. Pupils are equal, round, and reactive to light.  Neck: Normal range of motion. Neck supple.       No meningsmus  Cardiovascular: Normal rate, regular rhythm and intact distal pulses.   Pulmonary/Chest: Effort normal and breath sounds normal. She has no wheezes. She has no rales.  Abdominal: Soft. Bowel sounds are normal. There is no tenderness. There is no rebound and no guarding.  Musculoskeletal: Normal range of motion.  Lymphadenopathy:    She has no cervical adenopathy.  Neurological: She is oriented to person, place, and time. She has normal reflexes. No cranial nerve deficit.  Skin: Skin is warm and dry. She is not diaphoretic.  Psychiatric: She has a normal mood and affect.    ED Course  Procedures (including critical care time)  Labs Reviewed - No data to display No results found.   No diagnosis found.    MDM  No changes in thinking speech or cognition.  No changes in vision.  No  f/c/r.  Headache > 1 year no indication for LP.  Follow up with your regular doctor.  Return for changes in vision, speech, weakness numbness.       Jasmine Awe, MD 08/12/12 [phone]

## 2012-08-12 ENCOUNTER — Emergency Department (HOSPITAL_COMMUNITY): Payer: Self-pay

## 2012-08-12 ENCOUNTER — Encounter (HOSPITAL_COMMUNITY): Payer: Self-pay | Admitting: Radiology

## 2012-08-12 MED ORDER — NAPROXEN 375 MG PO TABS: 375.0000 mg | ORAL_TABLET | Freq: Two times a day (BID) | ORAL | Status: DC

## 2012-08-12 NOTE — ED Notes (Signed)
Patient transported to CT 

## 2013-04-17 ENCOUNTER — Encounter (HOSPITAL_COMMUNITY): Payer: Self-pay | Admitting: Emergency Medicine

## 2013-04-17 DIAGNOSIS — Z79899 Other long term (current) drug therapy: Secondary | ICD-10-CM | POA: Insufficient documentation

## 2013-04-17 DIAGNOSIS — N949 Unspecified condition associated with female genital organs and menstrual cycle: Secondary | ICD-10-CM | POA: Insufficient documentation

## 2013-04-17 DIAGNOSIS — Z3202 Encounter for pregnancy test, result negative: Secondary | ICD-10-CM | POA: Insufficient documentation

## 2013-04-17 DIAGNOSIS — Z975 Presence of (intrauterine) contraceptive device: Secondary | ICD-10-CM | POA: Insufficient documentation

## 2013-04-17 LAB — URINALYSIS, ROUTINE W REFLEX MICROSCOPIC
Glucose, UA: NEGATIVE mg/dL
Hgb urine dipstick: NEGATIVE
Ketones, ur: 15 mg/dL — AB
Leukocytes, UA: NEGATIVE
Protein, ur: NEGATIVE mg/dL
Urobilinogen, UA: 1 mg/dL (ref 0.0–1.0)

## 2013-04-17 NOTE — ED Notes (Signed)
Presents with lower abdominal pain with raidiation onto the vagina ongoing for 2 weeks associated with white discharge.

## 2013-04-18 ENCOUNTER — Ambulatory Visit (HOSPITAL_COMMUNITY): Admission: RE | Admit: 2013-04-18 | Payer: Medicaid Other | Source: Ambulatory Visit

## 2013-04-18 ENCOUNTER — Emergency Department (HOSPITAL_COMMUNITY)
Admission: EM | Admit: 2013-04-18 | Discharge: 2013-04-18 | Disposition: A | Discharge status: Home / Self Care | Payer: Medicaid Other | Attending: Emergency Medicine | Admitting: Emergency Medicine

## 2013-04-18 DIAGNOSIS — R102 Pelvic and perineal pain: Secondary | ICD-10-CM

## 2013-04-18 LAB — WET PREP, GENITAL
Trich, Wet Prep: NONE SEEN
Yeast Wet Prep HPF POC: NONE SEEN

## 2013-04-18 MED ORDER — HYDROCODONE-ACETAMINOPHEN 5-325 MG PO TABS: 2.0000 | ORAL_TABLET | ORAL | Status: DC | PRN

## 2013-04-18 NOTE — ED Notes (Signed)
Pelvic cart placed at bedside.  

## 2013-04-18 NOTE — ED Notes (Signed)
Pt requesting to go home. Pt states she cannot wait anymore because she has to get her kids up in an hour. RN apologized for long wait and notified MD.

## 2013-04-18 NOTE — ED Provider Notes (Signed)
CSN: [id number]     Arrival date & time 04/17/13  1958 History   First MD Initiated Contact with Patient 04/18/13 0028     Chief Complaint  Patient presents with  . Vaginal Pain   (Consider location/radiation/quality/duration/timing/severity/associated sxs/prior Treatment) HPI 27 yo female presents to the ER from home with complaint of suprapubic pain x 2 weeks.  Pain initially for 3 days with some white discharge, then resolved.  No urinary symptoms.  Pain started back again yesterday, worse.  Pain is sharp, stabbing.  No fever, no n/v/d.  Pt reports no new sexual partners, had intercourse yesterday without problems.  [facility]   Has been taking motrin without improvement  Past Medical History  Diagnosis Date  . [id number](244.0)    Past Surgical History  Procedure Laterality Date  . Cesarean section  2008,2010  . Cesarean section  06/19/2011    Procedure: CESAREAN SECTION;  Surgeon: Kathreen Cosier, MD;  Location: [facility];  Service: Gynecology;  Laterality: N/A;   History reviewed. No pertinent family history. History  Substance Use Topics  . Smoking status: Never Smoker   . Smokeless tobacco: Not on file  . Alcohol Use: Yes     Comment: Occassional Use   OB History   Grav Para Term Preterm Abortions TAB SAB Ect Mult Living   4 3 3  1     3      Review of Systems  All other systems reviewed and are negative.    Allergies  Review of patient's allergies indicates no known allergies.  Home Medications   Current Outpatient Rx  Name  Route  Sig  Dispense  Refill  . HYDROCORTISONE ACETATE EX   Topical   Apply 1 application topically 2 (two) times daily as needed (itching).         Marland Kitchen ibuprofen (ADVIL,MOTRIN) 200 MG tablet   Oral   Take 200 mg by mouth every 6 (six) hours as needed for pain.         Marland Kitchen HYDROcodone-acetaminophen (NORCO/VICODIN) 5-325 MG per tablet   Oral   Take 2 tablets by mouth every 4 (four) hours as  needed for pain.   20 tablet   0    BP 111/74  Pulse 75  Temp(Src) 97 F (36.1 C) (Oral)  Resp 17  Wt 208 lb (94.348 kg)  BMI 38.03 kg/m2  SpO2 97% Physical Exam  Nursing note and vitals reviewed. Constitutional: She is oriented to person, place, and time. She appears well-developed and well-nourished.  HENT:  Head: Normocephalic and atraumatic.  Nose: Nose normal.  Mouth/Throat: Oropharynx is clear and moist.  Eyes: Conjunctivae and EOM are normal. Pupils are equal, round, and reactive to light.  Neck: Normal range of motion. Neck supple. No JVD present. No tracheal deviation present. No thyromegaly present.  Cardiovascular: Normal rate, regular rhythm, normal heart sounds and intact distal pulses.  Exam reveals no gallop and no friction rub.   No murmur heard. Pulmonary/Chest: Effort normal and breath sounds normal. No stridor. No respiratory distress. She has no wheezes. She has no rales. She exhibits no tenderness.  Abdominal: Soft. Bowel sounds are normal. She exhibits no distension and no mass. There is tenderness (mild suprapubic tenderness). There is no rebound and no guarding.  Genitourinary:  External genitalia normal Vagina with white discharge Cervix closed no lesions, no strings visualized No cervical motion tenderness Adnexa palpated, no masses but moderate tenderness noted on left  Bladder palpated no tenderness Uterus palpated no masses but moderate tenderness noted    Musculoskeletal: Normal range of motion. She exhibits no edema and no tenderness.  Lymphadenopathy:    She has no cervical adenopathy.  Neurological: She is alert and oriented to person, place, and time. She exhibits normal muscle tone. Coordination normal.  Skin: Skin is warm and dry. No rash noted. No erythema. No pallor.  Psychiatric: She has a normal mood and affect. Her behavior is normal. Judgment and thought content normal.    ED Course  Procedures (including critical care time) Labs  Review Labs Reviewed  WET PREP, GENITAL - Abnormal; Notable for the following:    Clue Cells Wet Prep HPF POC FEW (*)    WBC, Wet Prep HPF POC FEW (*)    All other components within normal limits  URINALYSIS, ROUTINE W REFLEX MICROSCOPIC - Abnormal; Notable for the following:    Color, Urine AMBER (*)    APPearance CLOUDY (*)    Bilirubin Urine SMALL (*)    Ketones, ur 15 (*)    All other components within normal limits  GC/CHLAMYDIA PROBE AMP  POCT PREGNANCY, URINE   Imaging Review No results found.  EKG Interpretation   None       MDM   1. Pelvic pain    27 yo female with 2 weeks of intermittent pelvic pain.  Pt with h/o IUD.  Pelvic exam with TTP over uterus, llq.  Unable to visualize strings.  Ordered US, but after several hours unable to complete, and patient needed to leave.  Pt instructed to f/u with health dept and/or gyn clinic.    Olivia Mackie, MD 04/18/13 [phone]

## 2013-04-18 NOTE — ED Notes (Signed)
RN apologized to patient for long wait for [place]. RN explained process. Pt communicated understanding. RN called [place], but no answer.

## 2013-04-19 LAB — GC/CHLAMYDIA PROBE AMP: GC Probe RNA: NEGATIVE

## 2013-08-05 ENCOUNTER — Inpatient Hospital Stay (HOSPITAL_COMMUNITY)
Admission: AD | Admit: 2013-08-05 | Discharge: 2013-08-05 | Disposition: A | Discharge status: Home / Self Care | Payer: Medicaid Other | Source: Ambulatory Visit | Attending: Obstetrics & Gynecology | Admitting: Obstetrics & Gynecology

## 2013-08-05 ENCOUNTER — Inpatient Hospital Stay (HOSPITAL_COMMUNITY): Payer: Self-pay

## 2013-08-05 ENCOUNTER — Encounter (HOSPITAL_COMMUNITY): Payer: Self-pay | Admitting: *Deleted

## 2013-08-05 DIAGNOSIS — Z975 Presence of (intrauterine) contraceptive device: Secondary | ICD-10-CM | POA: Insufficient documentation

## 2013-08-05 DIAGNOSIS — R109 Unspecified abdominal pain: Secondary | ICD-10-CM | POA: Insufficient documentation

## 2013-08-05 DIAGNOSIS — R102 Pelvic and perineal pain: Secondary | ICD-10-CM

## 2013-08-05 DIAGNOSIS — T8332XA Displacement of intrauterine contraceptive device, initial encounter: Secondary | ICD-10-CM

## 2013-08-05 DIAGNOSIS — N949 Unspecified condition associated with female genital organs and menstrual cycle: Secondary | ICD-10-CM | POA: Insufficient documentation

## 2013-08-05 DIAGNOSIS — N898 Other specified noninflammatory disorders of vagina: Secondary | ICD-10-CM | POA: Insufficient documentation

## 2013-08-05 DIAGNOSIS — R1084 Generalized abdominal pain: Secondary | ICD-10-CM

## 2013-08-05 LAB — URINALYSIS, ROUTINE W REFLEX MICROSCOPIC
Bilirubin Urine: NEGATIVE
Glucose, UA: NEGATIVE mg/dL
Hgb urine dipstick: NEGATIVE
Ketones, ur: NEGATIVE mg/dL
LEUKOCYTES UA: NEGATIVE
NITRITE: NEGATIVE
PROTEIN: NEGATIVE mg/dL
Specific Gravity, Urine: 1.025 (ref 1.005–1.030)
Urobilinogen, UA: 0.2 mg/dL (ref 0.0–1.0)
pH: 6 (ref 5.0–8.0)

## 2013-08-05 LAB — WET PREP, GENITAL
Trich, Wet Prep: NONE SEEN
Yeast Wet Prep HPF POC: NONE SEEN

## 2013-08-05 LAB — POCT PREGNANCY, URINE: Preg Test, Ur: NEGATIVE

## 2013-08-05 NOTE — Discharge Instructions (Signed)
Pelvic Pain, Female °Female pelvic pain can be caused by many different things and start from a variety of places. Pelvic pain refers to pain that is located in the lower half of the abdomen and between your hips. The pain may occur over a short period of time (acute) or may be reoccurring (chronic). The cause of pelvic pain may be related to disorders affecting the female reproductive organs (gynecologic), but it may also be related to the bladder, kidney stones, an intestinal complication, or muscle or skeletal problems. Getting help right away for pelvic pain is important, especially if there has been severe, sharp, or a sudden onset of unusual pain. It is also important to get help right away because some types of pelvic pain can be life threatening.  °CAUSES  °Below are only some of the causes of pelvic pain. The causes of pelvic pain can be in one of several categories.  °· Gynecologic. °· Pelvic inflammatory disease. °· Sexually transmitted infection. °· Ovarian cyst or a twisted ovarian ligament (ovarian torsion). °· Uterine lining that grows outside the uterus (endometriosis). °· Fibroids, cysts, or tumors. °· Ovulation. °· Pregnancy. °· Pregnancy that occurs outside the uterus (ectopic pregnancy). °· Miscarriage. °· Labor. °· Abruption of the placenta or ruptured uterus. °· Infection. °· Uterine infection (endometritis). °· Bladder infection. °· Diverticulitis. °· Miscarriage related to a uterine infection (septic abortion). °· Bladder. °· Inflammation of the bladder (cystitis). °· Kidney stone(s). °· Gastrointenstinal. °· Constipation. °· Diverticulitis. °· Neurologic. °· Trauma. °· Feeling pelvic pain because of mental or emotional causes (psychosomatic). °· Cancers of the bowel or pelvis. °EVALUATION  °Your caregiver will want to take a careful history of your concerns. This includes recent changes in your health, a careful gynecologic history of your periods (menses), and a sexual history. Obtaining  your family history and medical history is also important. Your caregiver may suggest a pelvic exam. A pelvic exam will help identify the location and severity of the pain. It also helps in the evaluation of which organ system may be involved. In order to identify the cause of the pelvic pain and be properly treated, your caregiver may order tests. These tests may include:  °· A pregnancy test. °· Pelvic ultrasonography. °· An X-ray exam of the abdomen. °· A urinalysis or evaluation of vaginal discharge. °· Blood tests. °HOME CARE INSTRUCTIONS  °· Only take over-the-counter or prescription medicines for pain, discomfort, or fever as directed by your caregiver.   °· Rest as directed by your caregiver.   °· Eat a balanced diet.   °· Drink enough fluids to make your urine clear or pale yellow, or as directed.   °· Avoid sexual intercourse if it causes pain.   °· Apply warm or cold compresses to the lower abdomen depending on which one helps the pain.   °· Avoid stressful situations.   °· Keep a journal of your pelvic pain. Write down when it started, where the pain is located, and if there are things that seem to be associated with the pain, such as food or your menstrual cycle. °· Follow up with your caregiver as directed.   °SEEK MEDICAL CARE IF: °· Your medicine does not help your pain. °· You have abnormal vaginal discharge. °SEEK IMMEDIATE MEDICAL CARE IF:  °· You have heavy bleeding from the vagina.   °· Your pelvic pain increases.   °· You feel lightheaded or faint.   °· You have chills.   °· You have pain with urination or blood in your urine.   °· You have uncontrolled   diarrhea or vomiting.   °· You have a fever or persistent symptoms for more than 3 days. °· You have a fever and your symptoms suddenly get worse.   °· You are being physically or sexually abused.   °MAKE SURE YOU: °· Understand these instructions. °· Will watch your condition. °· Will get help if you are not doing well or get worse. °Document  Released: 05/22/2004 Document Revised: 12/25/2011 Document Reviewed: 10/15/2011 °ExitCare® Patient Information ©2014 [employer]. ° °

## 2013-08-05 NOTE — MAU Provider Note (Signed)
Chief Complaint: Abdominal Pain and Vaginal Discharge   First Provider Initiated Contact with Patient 08/05/13 1810     SUBJECTIVE HPI: Martha Stevens is a 28 y.o. [phone] who presents to maternity admissions reporting abdominal cramping x2 weeks, vaginal discharge x several days described as clear, sticky, with no odor, and IUD in place x2 years.  She reports she desires removal of IUD but was told recently that the strings were not visible.  Her Mirena IUD was placed at [facility].  She denies vaginal bleeding, vaginal itching/burning, urinary symptoms, h/a, dizziness, n/v, or fever/chills. The pt reports she is not currently sexually active and would like to start OCPs for any future contraceptive needs.  Past Medical History  Diagnosis Date  . GNFAOZHY(865.7)    Past Surgical History  Procedure Laterality Date  . Cesarean section  2008,2010  . Cesarean section  06/19/2011    Procedure: CESAREAN SECTION;  Surgeon: Kathreen Cosier, MD;  Location: [facility];  Service: Gynecology;  Laterality: N/A;   History   Social History  . Marital Status: Legally Separated    Spouse Name: N/A    Number of Children: N/A  . Years of Education: N/A   Occupational History  . Not on file.   Social History Main Topics  . Smoking status: Never Smoker   . Smokeless tobacco: Not on file  . Alcohol Use: Yes     Comment: Occassional Use  . Drug Use: No  . Sexual Activity: Yes   Other Topics Concern  . Not on file   Social History Narrative  . No narrative on file   No current facility-administered medications on file prior to encounter.   No current outpatient prescriptions on file prior to encounter.   No Known Allergies  ROS: Pertinent items in HPI  OBJECTIVE Blood pressure 117/67, pulse 95, temperature 98.4 F (36.9 C), temperature source Oral, resp. rate 16, height 5\' 2"  (1.575 m), weight 92.715 kg (204 lb 6.4 oz), SpO2 100.00%. GENERAL: Well-developed, well-nourished female in no acute  distress.  HEENT: Normocephalic HEART: normal rate RESP: normal effort ABDOMEN: Soft, non-tender, no rebound tenderness, no guarding EXTREMITIES: Nontender, no edema NEURO: Alert and oriented Pelvic exam: Cervix pink, visually closed, without lesion, scant clear discharge, IUD strings NOT visible, vaginal walls and external genitalia normal Bimanual exam: Cervix 0/long/high, firm, anterior, neg CMT, uterus mildly tender, nonenlarged, adnexa without tenderness, enlargement, or mass  Attempt to find IUD strings with cotton swab and kelly clamp without success, pt tolerated well  LAB RESULTS Results for orders placed during the hospital encounter of 08/05/13 (from the past 24 hour(s))  URINALYSIS, ROUTINE W REFLEX MICROSCOPIC     Status: None   Collection Time    08/05/13  4:55 PM      Result Value Range   Color, Urine YELLOW  YELLOW   APPearance CLEAR  CLEAR   Specific Gravity, Urine 1.025  1.005 - 1.030   pH 6.0  5.0 - 8.0   Glucose, UA NEGATIVE  NEGATIVE mg/dL   Hgb urine dipstick NEGATIVE  NEGATIVE   Bilirubin Urine NEGATIVE  NEGATIVE   Ketones, ur NEGATIVE  NEGATIVE mg/dL   Protein, ur NEGATIVE  NEGATIVE mg/dL   Urobilinogen, UA 0.2  0.0 - 1.0 mg/dL   Nitrite NEGATIVE  NEGATIVE   Leukocytes, UA NEGATIVE  NEGATIVE  POCT PREGNANCY, URINE     Status: None   Collection Time    08/05/13  5:20 PM      Result  Value Range   Preg Test, Ur NEGATIVE  NEGATIVE  WET PREP, GENITAL     Status: Abnormal   Collection Time    08/05/13  6:25 PM      Result Value Range   Yeast Wet Prep HPF POC NONE SEEN  NONE SEEN   Trich, Wet Prep NONE SEEN  NONE SEEN   Clue Cells Wet Prep HPF POC FEW (*) NONE SEEN   WBC, Wet Prep HPF POC FEW (*) NONE SEEN    IMAGING [place] Transvaginal Non-ob  08/05/2013   CLINICAL DATA:  IUD strings not visible. Pelvic pain. Negative urine pregnancy test.  EXAM: TRANSABDOMINAL AND TRANSVAGINAL ULTRASOUND OF PELVIS  TECHNIQUE: Both transabdominal and transvaginal  ultrasound examinations of the pelvis were performed. Transabdominal technique was performed for global imaging of the pelvis including uterus, ovaries, adnexal regions, and pelvic cul-de-sac. It was necessary to proceed with endovaginal exam following the transabdominal exam to visualize the endometrium and ovaries.  COMPARISON:  None  FINDINGS: Uterus  Measurements: 4.3 x 5.2 x 8.5 cm. No fibroids or other mass visualized. IUD visualized.  Endometrium  Thickness: 7.4 mm. No focal abnormality visualized. IUD visualized over the body to fundal endometrium.  Right ovary  Measurements: 2.6 x 2.6 x 3.5 cm. Normal appearance/no adnexal mass.  Left ovary  Measurements: 2.0 x 2.4 x 3.4 cm. Normal appearance/no adnexal mass.  Other findings  No free fluid.  IMPRESSION: IUD over the body to fundal endometrium. Otherwise, unremarkable pelvic ultrasound.   Electronically Signed   By: Elberta Fortisaniel  Boyle M.D.   On: 08/05/2013 19:12   [place] Pelvis Complete  08/05/2013   CLINICAL DATA:  IUD strings not visible. Pelvic pain. Negative urine pregnancy test.  EXAM: TRANSABDOMINAL AND TRANSVAGINAL ULTRASOUND OF PELVIS  TECHNIQUE: Both transabdominal and transvaginal ultrasound examinations of the pelvis were performed. Transabdominal technique was performed for global imaging of the pelvis including uterus, ovaries, adnexal regions, and pelvic cul-de-sac. It was necessary to proceed with endovaginal exam following the transabdominal exam to visualize the endometrium and ovaries.  COMPARISON:  None  FINDINGS: Uterus  Measurements: 4.3 x 5.2 x 8.5 cm. No fibroids or other mass visualized. IUD visualized.  Endometrium  Thickness: 7.4 mm. No focal abnormality visualized. IUD visualized over the body to fundal endometrium.  Right ovary  Measurements: 2.6 x 2.6 x 3.5 cm. Normal appearance/no adnexal mass.  Left ovary  Measurements: 2.0 x 2.4 x 3.4 cm. Normal appearance/no adnexal mass.  Other findings  No free fluid.  IMPRESSION: IUD over the  body to fundal endometrium. Otherwise, unremarkable pelvic ultrasound.   Electronically Signed   By: Elberta Fortisaniel  Boyle M.D.   On: 08/05/2013 19:12    ASSESSMENT 1. IUD threads lost   2. Pelvic pain in female     PLAN Discharge home F/U at health department for removal of IUD Ibuprofen for pain Return to MAU as needed    Medication List         acetaminophen 325 MG tablet  Commonly known as:  TYLENOL  Take 650 mg by mouth every 6 (six) hours as needed for moderate pain.           Follow-up Information   Schedule an appointment as soon as possible for a visit with [facility].   Contact information:   [address] [place] [address] [phone]      Sharen CounterLisa Leftwich-Kirby Certified Nurse-Midwife 08/05/2013  7:35 PM

## 2013-08-05 NOTE — MAU Note (Signed)
Pt states she started having pain really stron for about 1 wk. Pain started about 1 month ago. Pt states she started having discharge yesterday.

## 2013-08-05 NOTE — MAU Note (Signed)
Patient states she has had a Mirena IUD for about 2 years. States she has been having pain in the lower abdomen for about one week that radiates to the left side. States she is having a mucus discharge. Denies bleeding, nausea or vomiting. No S/S of flu. Has periods of feeling dizzy and has no appetite.

## 2013-08-06 LAB — GC/CHLAMYDIA PROBE AMP
CT Probe RNA: NEGATIVE
GC Probe RNA: NEGATIVE

## 2013-12-07 ENCOUNTER — Ambulatory Visit: Payer: Self-pay | Admitting: Internal Medicine

## 2013-12-07 VITALS — BP 124/72 | HR 95 | Temp 98.2°F | Resp 16 | Ht 62.0 in | Wt 215.4 lb

## 2013-12-07 DIAGNOSIS — R21 Rash and other nonspecific skin eruption: Secondary | ICD-10-CM

## 2013-12-07 DIAGNOSIS — L259 Unspecified contact dermatitis, unspecified cause: Secondary | ICD-10-CM

## 2013-12-07 LAB — GLUCOSE, POCT (MANUAL RESULT ENTRY): POC GLUCOSE: 95 mg/dL (ref 70–99)

## 2013-12-07 MED ORDER — HYDROCORTISONE 1 % EX OINT: 1.0000 "application " | TOPICAL_OINTMENT | Freq: Two times a day (BID) | CUTANEOUS | Status: DC

## 2013-12-07 NOTE — Progress Notes (Signed)
   Subjective:    Patient ID: Martha Stevens, female    DOB: 20-Jun-1986, 28 y.o.   MRN: [id number]  Rash Pertinent negatives include no fatigue or fever.   CC: rash on hands HPI: 28 year old female with dark rash to bothe hands. Onset 3 weeks ago after being out in the sun for 2 days. The rest of her body was covered other than her hands. Does not remember getting a sun burn. The rash does not itch is a brawny discoloration of the hands, but whe is concerned it has spread over the past 3 weeks to her wrist area and a little to the crease between her breast area that is sun exposed. No other symptoms. Denies exposure to any new products or things at work to explain the rash. Non pruritic.   Review of Systems  Constitutional: Negative for fever, chills, activity change, appetite change and fatigue.  Skin: Positive for color change and rash.  All other systems reviewed and are negative.      Objective:   Physical Exam  Nursing note and vitals reviewed. Constitutional: She appears well-developed and well-nourished.  obese  HENT:  Head: Normocephalic and atraumatic.  Nose: Nose normal.  Mouth/Throat: Oropharynx is clear and moist.  Eyes: Conjunctivae and EOM are normal. Pupils are equal, round, and reactive to light.  Neck: Normal range of motion. Neck supple.  Cardiovascular: Normal rate, regular rhythm, normal heart sounds and intact distal pulses.   Pulmonary/Chest: Effort normal and breath sounds normal.  Abdominal: Soft.  Musculoskeletal: Normal range of motion.  Neurological: She is alert.  Skin: Skin is warm and dry. Rash noted.  Brawny discoloration of the dorsal surface of both of her hands to the wrist predominantly in the thenar areas of the hands and into the fingers. The skin is very dry non pruritic.   Psychiatric: She has a normal mood and affect. Her behavior is normal. Judgment and thought content normal.          Assessment & Plan:  Appears to be a contact  dermatitis, maybe a reaction to the sun. Will try cortisone cream and suggest suntan lotion and will refer to dermatology if no improvement in 2 weeks.

## 2013-12-07 NOTE — Patient Instructions (Signed)
Apply the cortisone to your hands 2 times daily. In between when you are out use sunscreen on tee hands. Return to the office if no improvement or worsening of your symptoms.

## 2013-12-25 IMAGING — CT CT HEAD W/O CM
1 series · 16 of 30 positions shown, 20 images · non-contrast
Comparison: None.

CLINICAL DATA: Migraines.  Diagnosed with chronic headaches 1 year
ago.  Recent episode of headaches lasting for a week.
Lightheadedness and dizziness.

CT HEAD WITHOUT CONTRAST
TECHNIQUE: Contiguous axial images were obtained from the base of
the skull through the vertex without contrast.

[Series 2: head routine 4.8 h37s · axial · 0.43mm/px · z∈[-118,+17]mm · 16 of 30 slices shown, 20 images]
[im 2/30  brain]
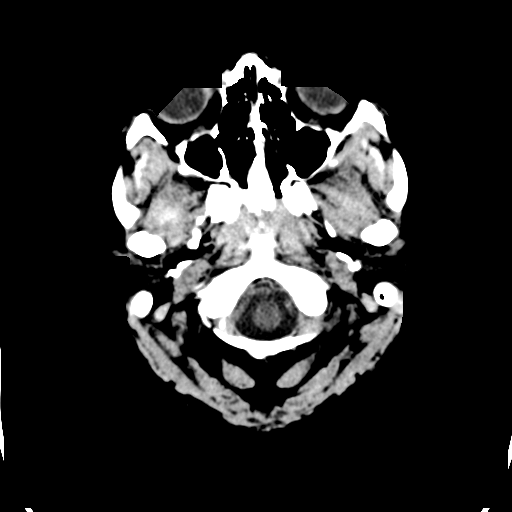
[im 2/30  bone]
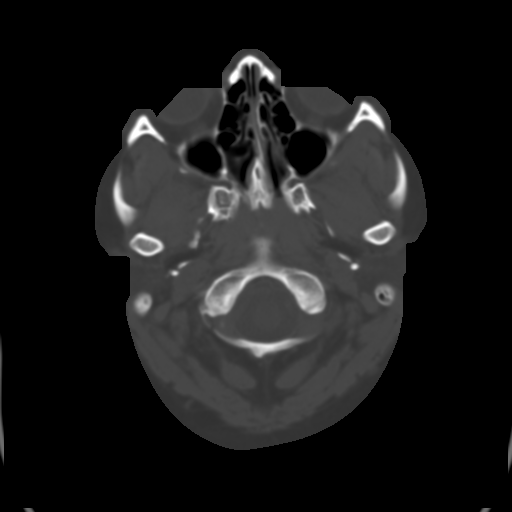
[im 4/30  brain]
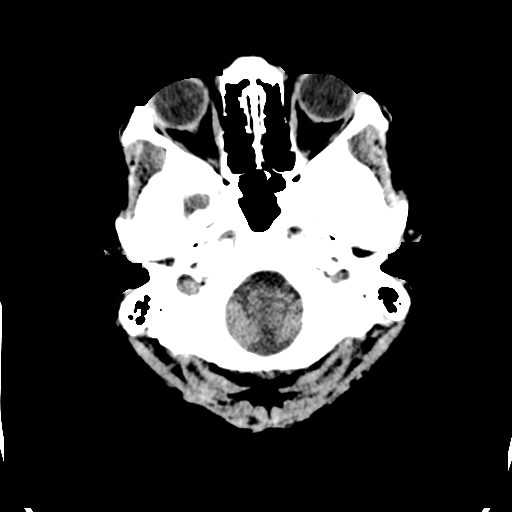
[im 6/30  brain]
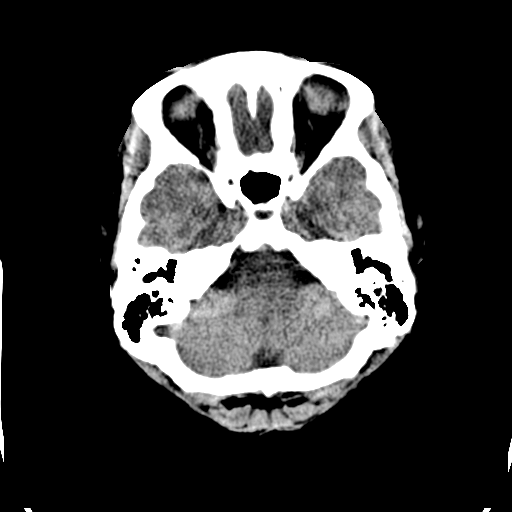
[im 8/30  brain]
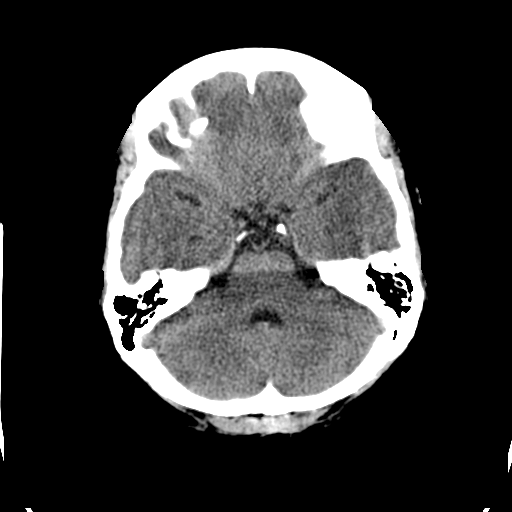
[im 9/30  brain]
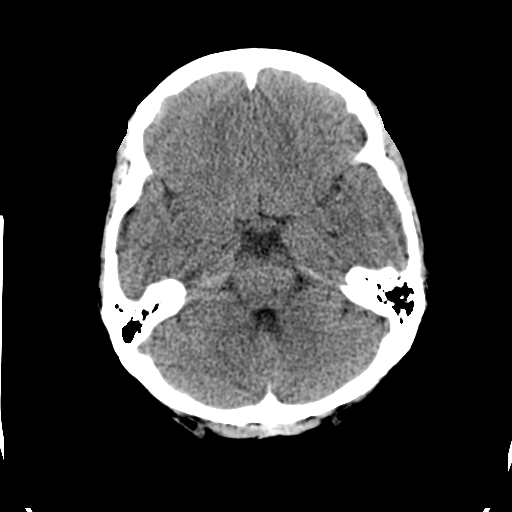
[im 9/30  bone]
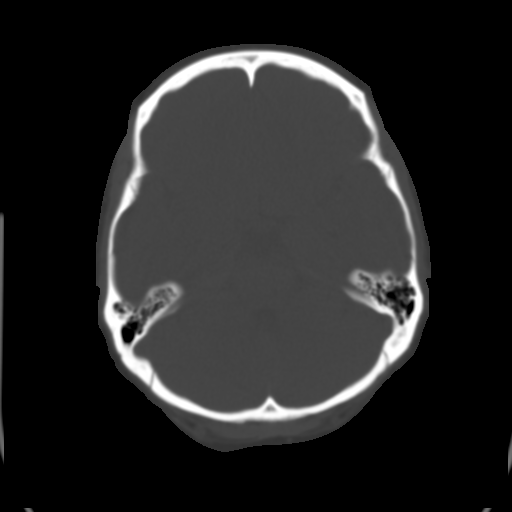
[im 11/30  brain]
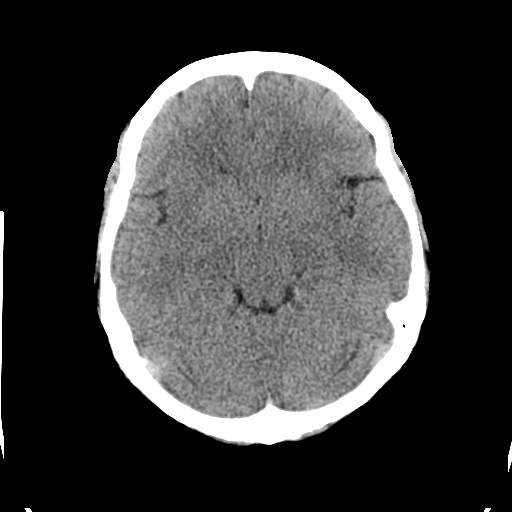
[im 13/30  brain]
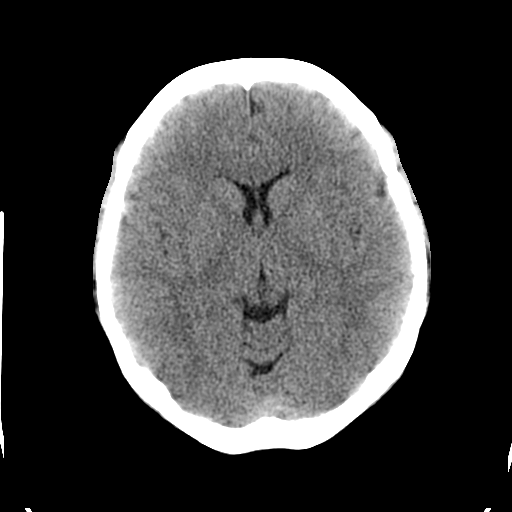
[im 15/30  brain]
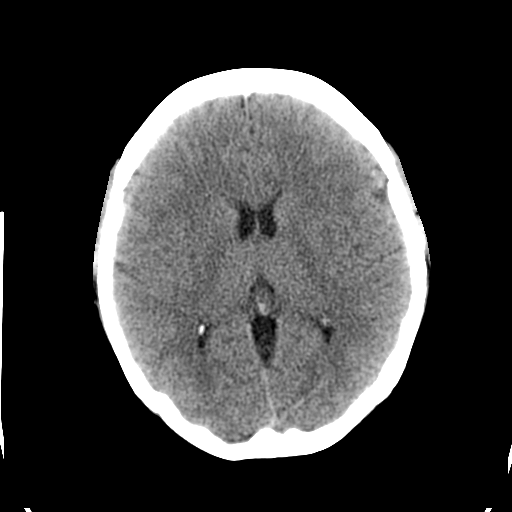
[im 16/30  brain]
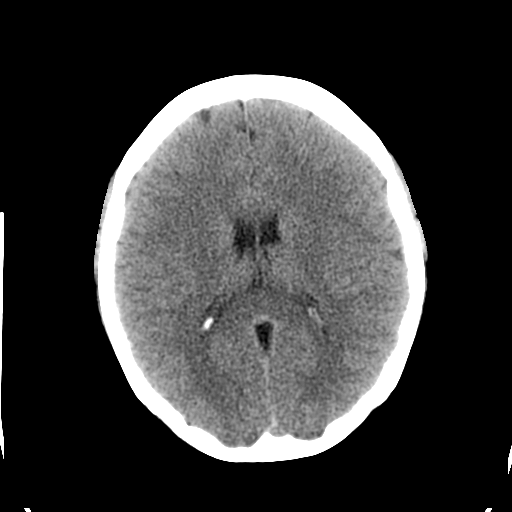
[im 16/30  bone]
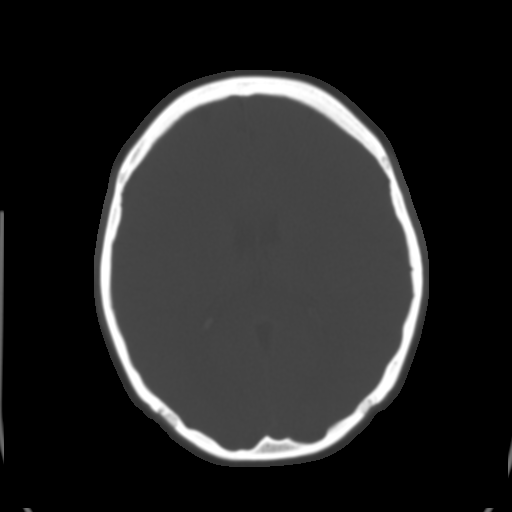
[im 18/30  brain]
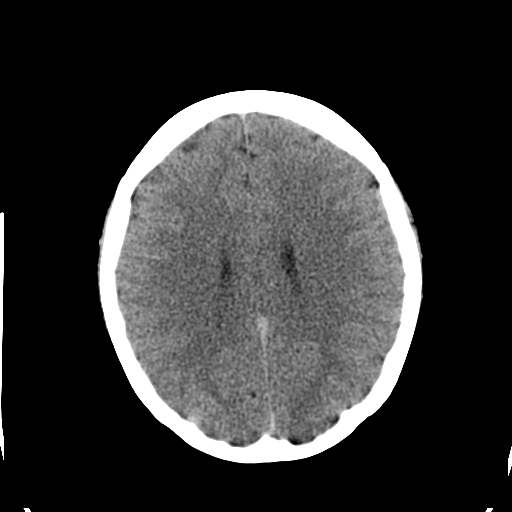
[im 20/30  brain]
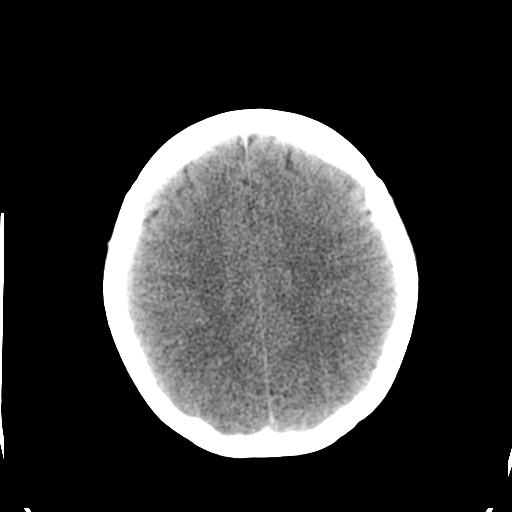
[im 22/30  brain]
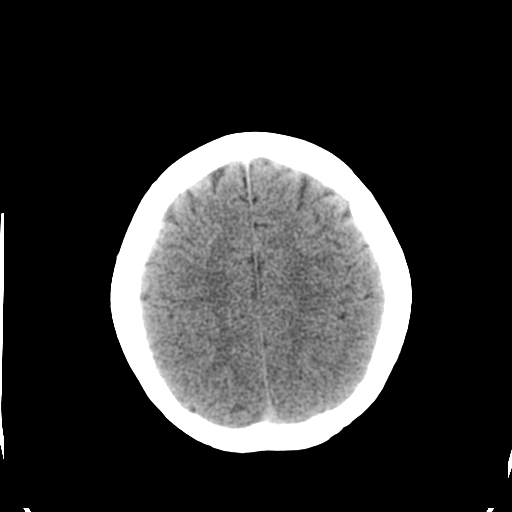
[im 23/30  brain]
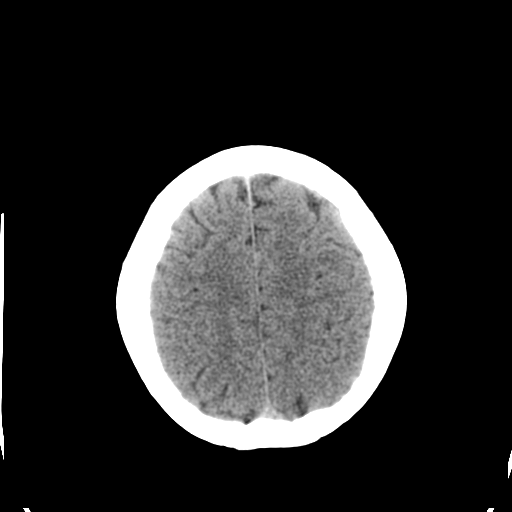
[im 23/30  bone]
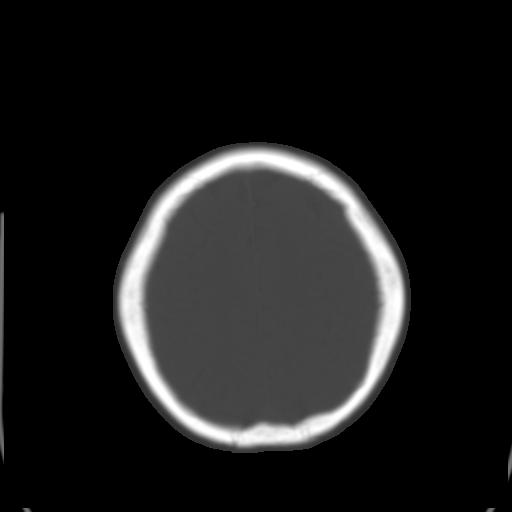
[im 25/30  brain]
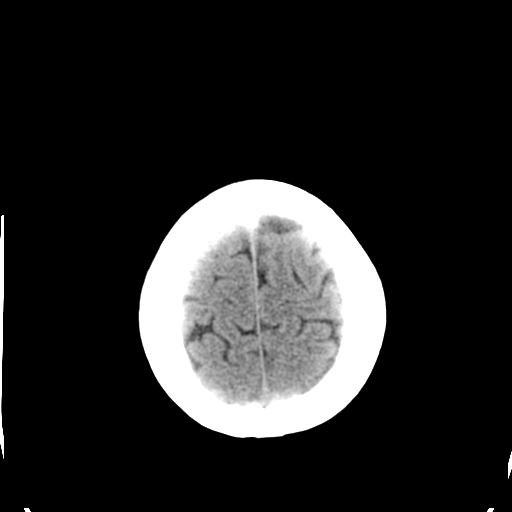
[im 27/30  brain]
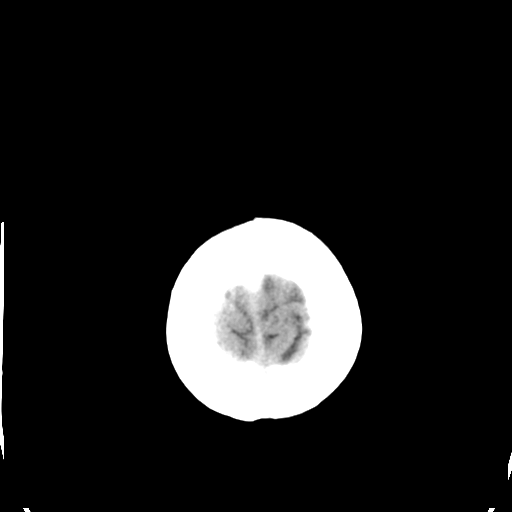
[im 29/30  brain]
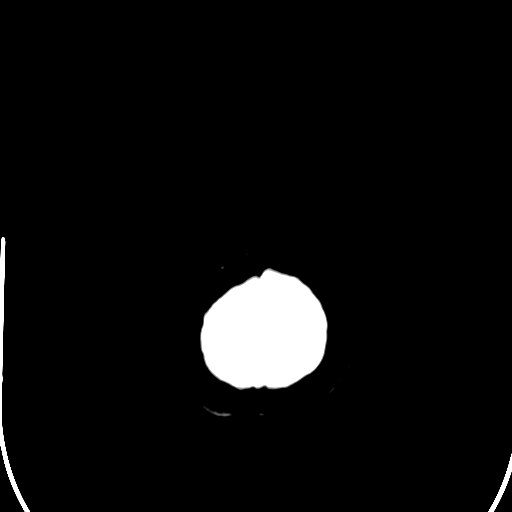

[16 of 30 positions shown; findings below may reference images not displayed]

FINDINGS: The ventricles and sulci are symmetrical without
significant effacement, displacement, or dilatation. No mass effect
or midline shift. No abnormal extra-axial fluid collections. The
grey-white matter junction is distinct. Basal cisterns are not
effaced. No acute intracranial hemorrhage. No depressed skull
fractures.  Visualized paranasal sinuses and mastoid air cells are
not opacified.
IMPRESSION: No acute intracranial abnormalities.

## 2014-10-18 LAB — OB RESULTS CONSOLE ABO/RH: RH TYPE: POSITIVE

## 2014-10-18 LAB — OB RESULTS CONSOLE GC/CHLAMYDIA
Chlamydia: NEGATIVE
Gonorrhea: NEGATIVE

## 2014-10-18 LAB — OB RESULTS CONSOLE HEPATITIS B SURFACE ANTIGEN: HEP B S AG: NEGATIVE

## 2014-10-18 LAB — OB RESULTS CONSOLE RPR: RPR: NONREACTIVE

## 2014-10-18 LAB — OB RESULTS CONSOLE ANTIBODY SCREEN: Antibody Screen: NEGATIVE

## 2014-10-18 LAB — OB RESULTS CONSOLE RUBELLA ANTIBODY, IGM: Rubella: IMMUNE

## 2014-12-18 IMAGING — US US TRANSVAGINAL NON-OB
1 series · 14 of 25 positions shown · non-contrast
Comparison: None

CLINICAL DATA: IUD strings not visible. Pelvic pain. Negative urine
pregnancy test.

EXAM:
TRANSABDOMINAL AND TRANSVAGINAL ULTRASOUND OF PELVIS
TECHNIQUE: Both transabdominal and transvaginal ultrasound examinations of the
pelvis were performed. Transabdominal technique was performed for
global imaging of the pelvis including uterus, ovaries, adnexal
regions, and pelvic cul-de-sac. It was necessary to proceed with
endovaginal exam following the transabdominal exam to visualize the
endometrium and ovaries.

[Series 1: us pelvis complete · 38 acquisitions, 14 frames shown]
[im 1/38]
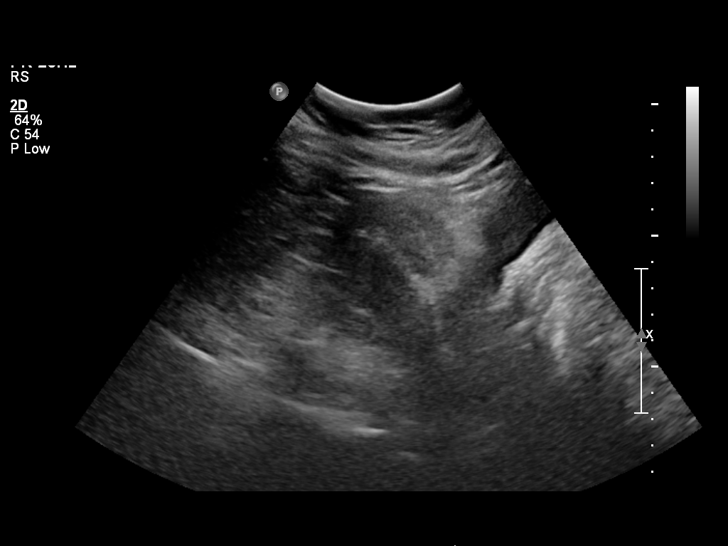
[im 4/38]
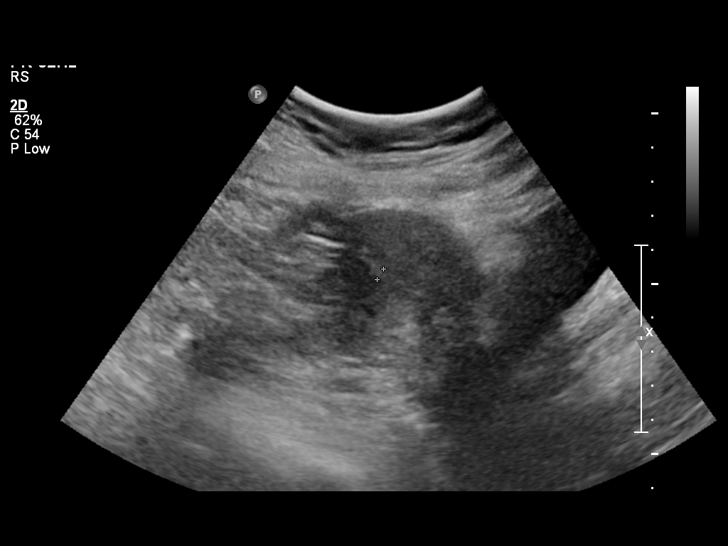
[im 7/38]
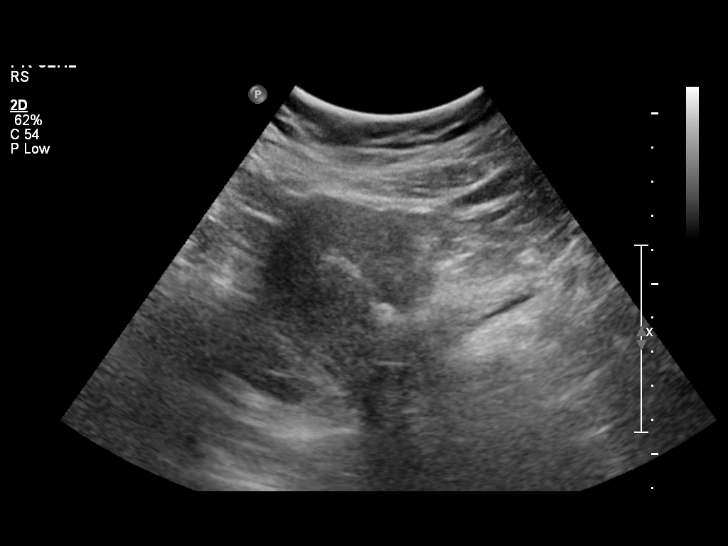
[im 10/38]
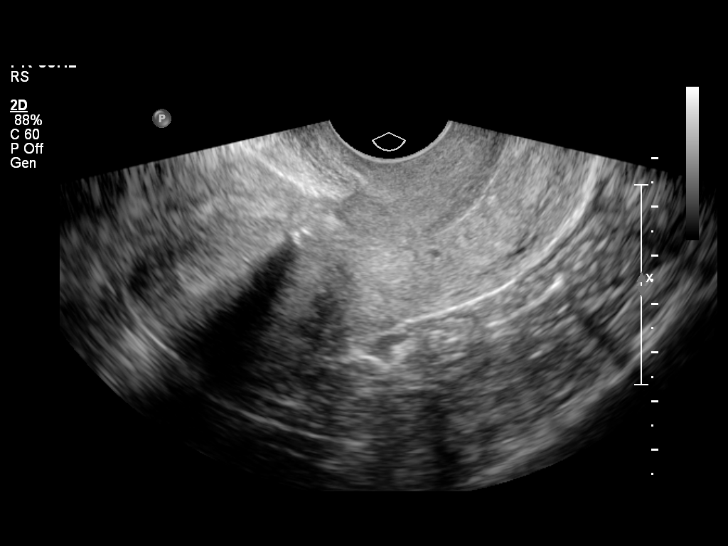
[im 13/38]
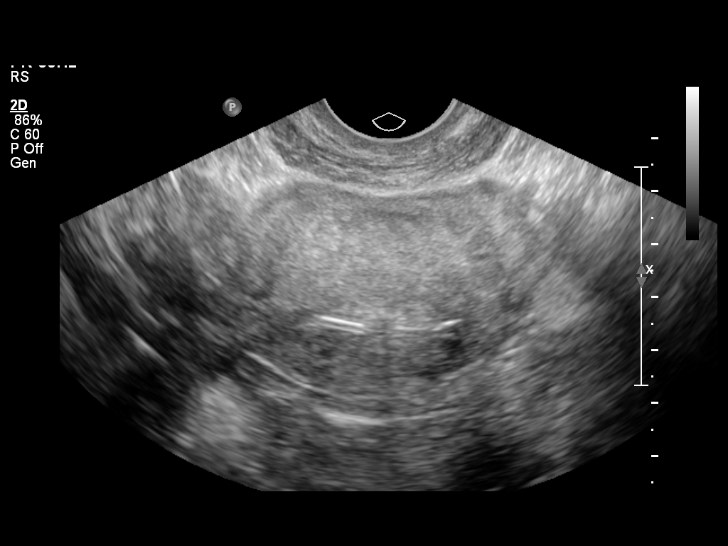
[im 14/38]
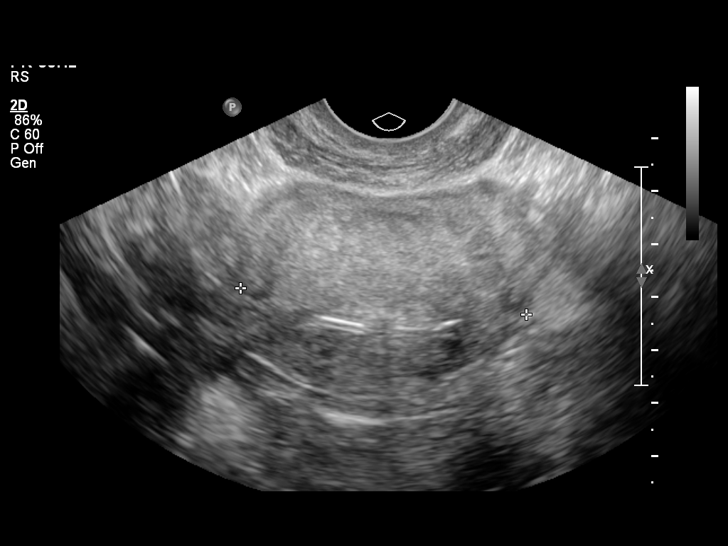
[im 17/38]
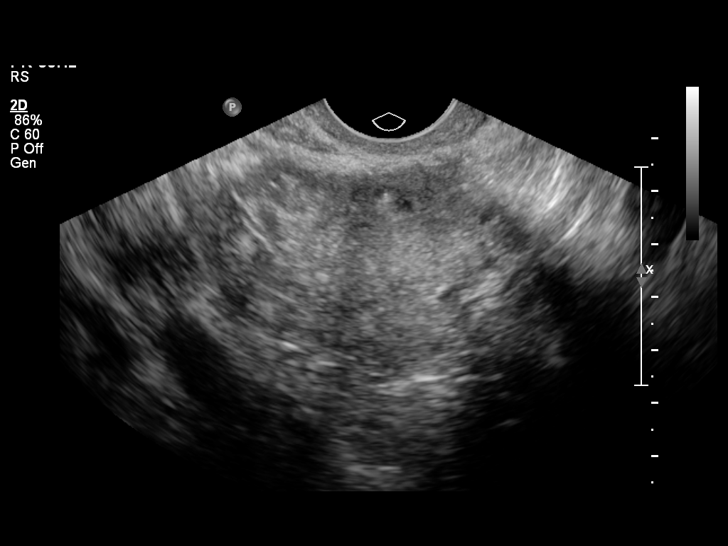
[im 21/38]
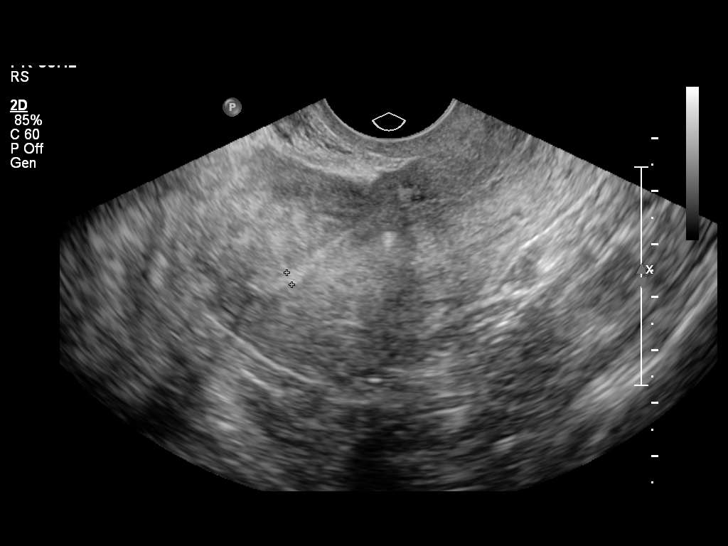
[im 24/38]
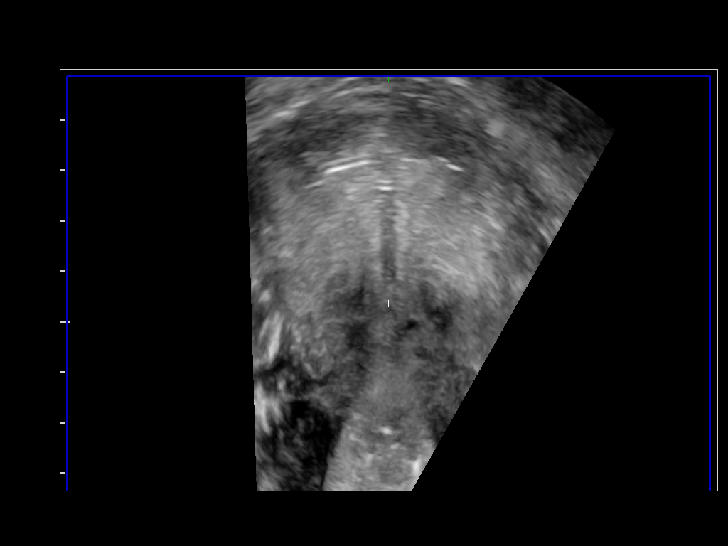
[im 25/38]
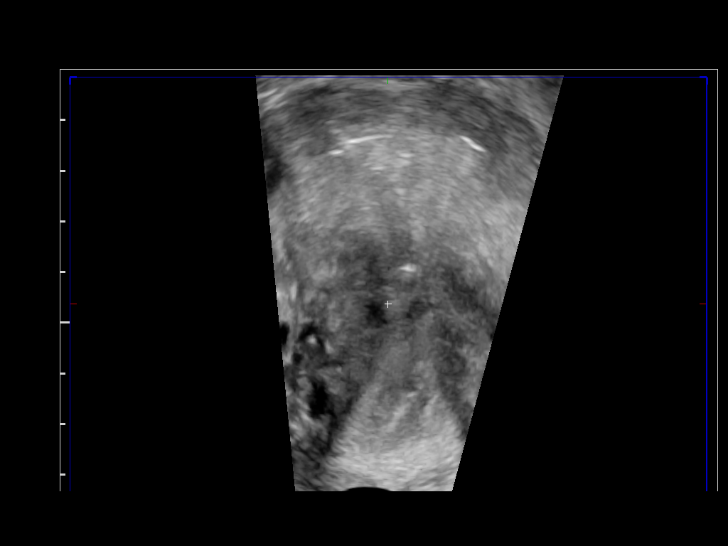
[im 28/38]
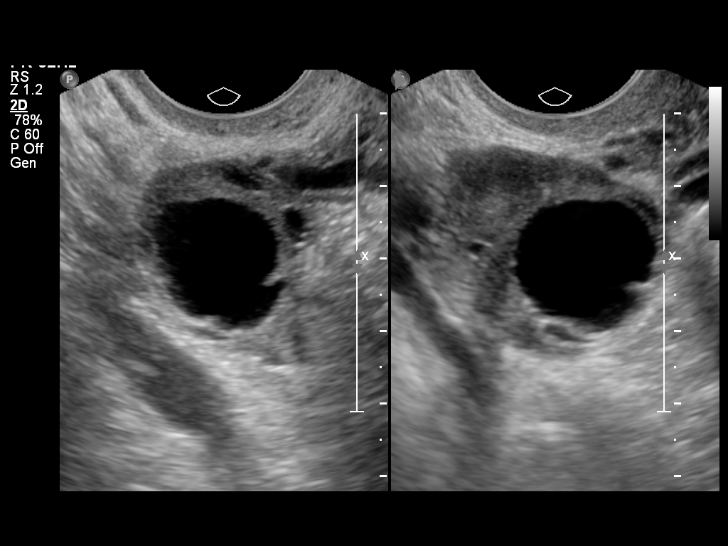
[im 31/38]
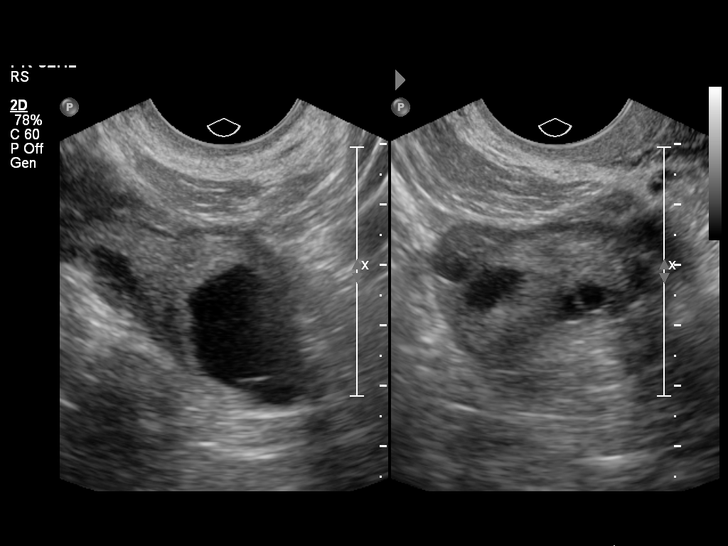
[im 34/38]
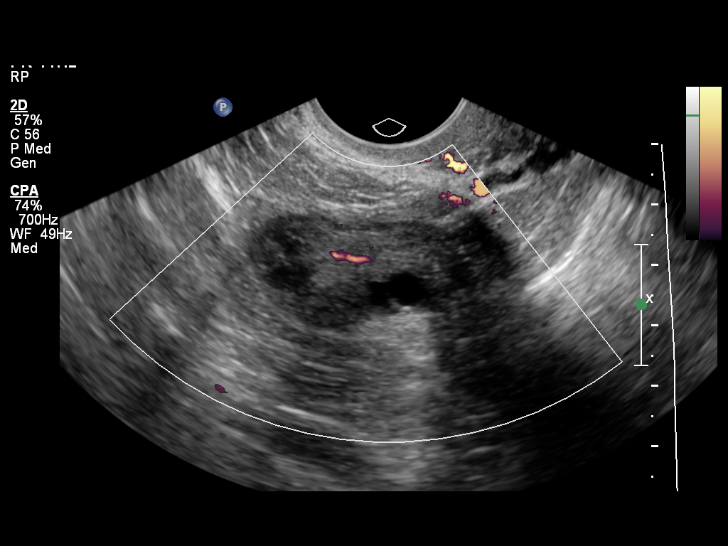
[im 38/38]
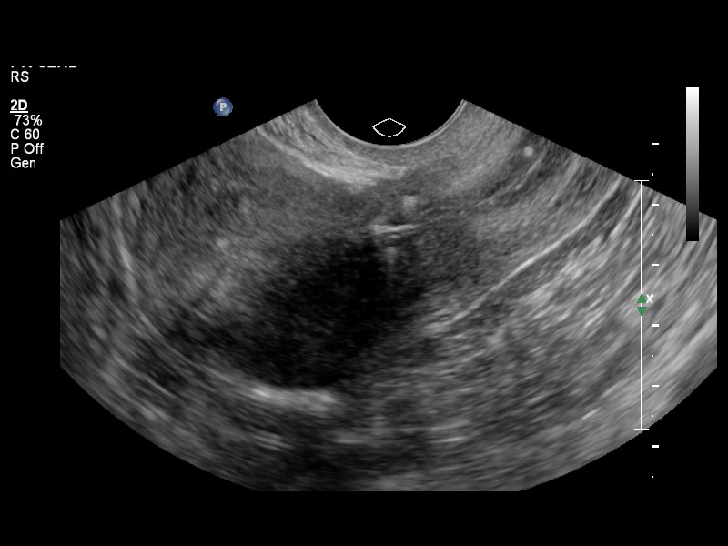

[14 of 25 positions shown; findings below may reference images not displayed]

FINDINGS: Uterus

Measurements: 4.3 x 5.2 x 8.5 cm. No fibroids or other mass
visualized. IUD visualized.

Endometrium

Thickness: 7.4 mm. No focal abnormality visualized. IUD visualized
over the body to fundal endometrium.

Right ovary

Measurements: 2.6 x 2.6 x 3.5 cm. Normal appearance/no adnexal mass.

Left ovary

Measurements: 2.0 x 2.4 x 3.4 cm. Normal appearance/no adnexal mass.

Other findings

No free fluid.
IMPRESSION: IUD over the body to fundal endometrium. Otherwise, unremarkable
pelvic ultrasound.

## 2015-02-15 ENCOUNTER — Other Ambulatory Visit: Payer: Self-pay | Admitting: Obstetrics

## 2015-03-03 NOTE — H&P (Signed)
NAME:  Belarus, Ellena                     ACCOUNT NO.:  MEDICAL RECORD NO.:  [id number]  LOCATION:                                 FACILITY:  PHYSICIAN:  Kathreen Cosier, M.D.DATE OF BIRTH:  1985/08/28  DATE OF ADMISSION: DATE OF DISCHARGE:                             HISTORY & PHYSICAL   DATE OF SURGERY:  March 09, 2015.  HISTORY OF PRESENT ILLNESS:  The patient is a 29 year old, gravida 5, para 3-0-1-3, who has had 3 previous C-sections.  Her EDC is March 15, 2015, and she is here for repeat C-section on March 09, 2015, and tubal ligation.  Her pregnancy was uncomplicated.  PAST MEDICAL HISTORY:  Negative.  PAST SURGICAL HISTORY:  Three C-sections.  SOCIAL HISTORY:  No smoking, no drinking, no drugs.  FAMILY HISTORY:  Negative.  SYSTEM REVIEW:  Negative.  PHYSICAL EXAMINATION:  GENERAL:  Obese female in no distress. HEENT:  Negative. LUNGS:  Clear to P and A. HEART:  Regular rhythm.  No murmurs, no gallops. BREASTS:  Negative. ABDOMEN:  Term. PELVIC:  Deferred. EXTREMITIES:  Negative.          ______________________________ Kathreen Cosier, M.D.     BAM/MEDQ  D:  03/02/2015  T:  03/03/2015  Job:  [id number]

## 2015-03-07 ENCOUNTER — Encounter (HOSPITAL_COMMUNITY): Payer: Self-pay

## 2015-03-08 ENCOUNTER — Encounter (HOSPITAL_COMMUNITY)
Admission: RE | Admit: 2015-03-08 | Discharge: 2015-03-08 | Disposition: A | Discharge status: Home / Self Care | Payer: Self-pay | Source: Ambulatory Visit | Attending: Obstetrics | Admitting: Obstetrics

## 2015-03-08 ENCOUNTER — Encounter (HOSPITAL_COMMUNITY): Payer: Self-pay

## 2015-03-08 DIAGNOSIS — Z01818 Encounter for other preprocedural examination: Secondary | ICD-10-CM | POA: Insufficient documentation

## 2015-03-08 LAB — CBC
HCT: 34.1 % — ABNORMAL LOW (ref 36.0–46.0)
Hemoglobin: 11.3 g/dL — ABNORMAL LOW (ref 12.0–15.0)
MCH: 29.3 pg (ref 26.0–34.0)
MCHC: 33.1 g/dL (ref 30.0–36.0)
MCV: 88.3 fL (ref 78.0–100.0)
Platelets: 199 10*3/uL (ref 150–400)
RBC: 3.86 MIL/uL — ABNORMAL LOW (ref 3.87–5.11)
RDW: 13.7 % (ref 11.5–15.5)
WBC: 7.7 10*3/uL (ref 4.0–10.5)

## 2015-03-08 LAB — TYPE AND SCREEN
ABO/RH(D): O POS
ANTIBODY SCREEN: NEGATIVE

## 2015-03-08 NOTE — Patient Instructions (Signed)
Your procedure is scheduled on:03/09/15 Enter through the Main Entrance at :6am Pick up desk phone and dial [phone] and inform us of your arrival.  Please call [phone] if you have any problems the morning of surgery.  Remember: Do not eat food or drink liquids, including water, after midnight:tonight    You may brush your teeth the morning of surgery.   DO NOT wear jewelry, eye make-up, lipstick,body lotion, or dark fingernail polish.  (Polished toes are ok) You may wear deodorant.  If you are to be admitted after surgery, leave suitcase in car until your room has been assigned. Patients discharged on the day of surgery will not be allowed to drive home. Wear loose fitting, comfortable clothes for your ride home.

## 2015-03-09 ENCOUNTER — Inpatient Hospital Stay (HOSPITAL_COMMUNITY)
Admission: RE | Admit: 2015-03-09 | Discharge: 2015-03-12 | DRG: 765 | Disposition: A | Discharge status: Home / Self Care | Payer: Medicaid Other | Source: Ambulatory Visit | Attending: Obstetrics | Admitting: Obstetrics

## 2015-03-09 ENCOUNTER — Encounter (HOSPITAL_COMMUNITY)
Admission: RE | Disposition: A | Discharge status: Home / Self Care | Payer: Self-pay | Source: Ambulatory Visit | Attending: Obstetrics

## 2015-03-09 ENCOUNTER — Inpatient Hospital Stay (HOSPITAL_COMMUNITY): Payer: Medicaid Other | Admitting: Anesthesiology

## 2015-03-09 ENCOUNTER — Encounter (HOSPITAL_COMMUNITY): Payer: Self-pay | Admitting: *Deleted

## 2015-03-09 DIAGNOSIS — Z302 Encounter for sterilization: Secondary | ICD-10-CM | POA: Diagnosis not present

## 2015-03-09 DIAGNOSIS — O99214 Obesity complicating childbirth: Secondary | ICD-10-CM | POA: Diagnosis present

## 2015-03-09 DIAGNOSIS — O3421 Maternal care for scar from previous cesarean delivery: Secondary | ICD-10-CM | POA: Diagnosis present

## 2015-03-09 DIAGNOSIS — Z3A39 39 weeks gestation of pregnancy: Secondary | ICD-10-CM | POA: Diagnosis present

## 2015-03-09 DIAGNOSIS — Z6841 Body Mass Index (BMI) 40.0 and over, adult: Secondary | ICD-10-CM

## 2015-03-09 DIAGNOSIS — O0943 Supervision of pregnancy with grand multiparity, third trimester: Secondary | ICD-10-CM | POA: Diagnosis not present

## 2015-03-09 DIAGNOSIS — Z98891 History of uterine scar from previous surgery: Secondary | ICD-10-CM

## 2015-03-09 LAB — RPR: RPR Ser Ql: NONREACTIVE

## 2015-03-09 SURGERY — Surgical Case
Anesthesia: Epidural | Site: Abdomen | Laterality: Bilateral

## 2015-03-09 MED ORDER — DIPHENHYDRAMINE HCL 25 MG PO CAPS: 25.0000 mg | ORAL_CAPSULE | Freq: Four times a day (QID) | ORAL | Status: DC | PRN

## 2015-03-09 MED ORDER — TETANUS-DIPHTH-ACELL PERTUSSIS 5-2.5-18.5 LF-MCG/0.5 IM SUSP: 0.5000 mL | Freq: Once | INTRAMUSCULAR | Status: DC

## 2015-03-09 MED ORDER — FENTANYL CITRATE (PF) 100 MCG/2ML IJ SOLN
25.0000 ug | INTRAMUSCULAR | Status: DC | PRN
  Administered 2015-03-09: 50 ug via INTRAVENOUS
  Administered 2015-03-09: 25 ug via INTRAVENOUS

## 2015-03-09 MED ORDER — SIMETHICONE 80 MG PO CHEW
80.0000 mg | CHEWABLE_TABLET | Freq: Three times a day (TID) | ORAL | Status: DC
  Administered 2015-03-11 – 2015-03-12 (×4): 80 mg via ORAL
  Filled 2015-03-09 (×4): qty 1

## 2015-03-09 MED ORDER — ONDANSETRON HCL 4 MG/2ML IJ SOLN: 4.0000 mg | Freq: Once | INTRAMUSCULAR | Status: DC | PRN

## 2015-03-09 MED ORDER — LACTATED RINGERS IV SOLN
INTRAVENOUS | Status: DC
  Administered 2015-03-09 (×3): via INTRAVENOUS

## 2015-03-09 MED ORDER — HYDROMORPHONE HCL 1 MG/ML IJ SOLN
1.0000 mg | Freq: Once | INTRAMUSCULAR | Status: AC
  Administered 2015-03-09: 1 mg via INTRAVENOUS
  Filled 2015-03-09: qty 1

## 2015-03-09 MED ORDER — DIPHENHYDRAMINE HCL 50 MG/ML IJ SOLN
12.5000 mg | INTRAMUSCULAR | Status: DC | PRN
  Filled 2015-03-09: qty 1

## 2015-03-09 MED ORDER — SIMETHICONE 80 MG PO CHEW: 80.0000 mg | CHEWABLE_TABLET | ORAL | Status: DC | PRN

## 2015-03-09 MED ORDER — ACETAMINOPHEN 325 MG PO TABS: 650.0000 mg | ORAL_TABLET | ORAL | Status: DC | PRN

## 2015-03-09 MED ORDER — CEFAZOLIN SODIUM-DEXTROSE 2-3 GM-% IV SOLR
INTRAVENOUS | Status: AC
  Filled 2015-03-09: qty 50

## 2015-03-09 MED ORDER — FENTANYL CITRATE (PF) 100 MCG/2ML IJ SOLN
INTRAMUSCULAR | Status: AC
  Administered 2015-03-09: 50 ug via INTRAVENOUS
  Filled 2015-03-09: qty 2

## 2015-03-09 MED ORDER — PHENYLEPHRINE 8 MG IN D5W 100 ML (0.08MG/ML) PREMIX OPTIME
INJECTION | INTRAVENOUS | Status: AC
  Filled 2015-03-09: qty 100

## 2015-03-09 MED ORDER — SIMETHICONE 80 MG PO CHEW
80.0000 mg | CHEWABLE_TABLET | ORAL | Status: DC
  Administered 2015-03-09 – 2015-03-12 (×3): 80 mg via ORAL
  Filled 2015-03-09 (×4): qty 1

## 2015-03-09 MED ORDER — LACTATED RINGERS IV SOLN
Freq: Once | INTRAVENOUS | Status: AC
  Administered 2015-03-09: 07:00:00 via INTRAVENOUS

## 2015-03-09 MED ORDER — OXYCODONE-ACETAMINOPHEN 5-325 MG PO TABS
1.0000 | ORAL_TABLET | ORAL | Status: DC | PRN
  Administered 2015-03-10 – 2015-03-12 (×6): 1 via ORAL
  Filled 2015-03-09 (×6): qty 1

## 2015-03-09 MED ORDER — ONDANSETRON HCL 4 MG/2ML IJ SOLN
4.0000 mg | Freq: Three times a day (TID) | INTRAMUSCULAR | Status: DC | PRN
  Administered 2015-03-09: 4 mg via INTRAVENOUS
  Filled 2015-03-09: qty 2

## 2015-03-09 MED ORDER — IBUPROFEN 600 MG PO TABS
600.0000 mg | ORAL_TABLET | Freq: Four times a day (QID) | ORAL | Status: DC
  Administered 2015-03-09 – 2015-03-12 (×11): 600 mg via ORAL
  Filled 2015-03-09 (×11): qty 1

## 2015-03-09 MED ORDER — OXYTOCIN 10 UNIT/ML IJ SOLN
INTRAMUSCULAR | Status: AC
  Filled 2015-03-09: qty 4

## 2015-03-09 MED ORDER — DIPHENHYDRAMINE HCL 25 MG PO CAPS: 25.0000 mg | ORAL_CAPSULE | ORAL | Status: DC | PRN

## 2015-03-09 MED ORDER — NALBUPHINE HCL 10 MG/ML IJ SOLN
5.0000 mg | INTRAMUSCULAR | Status: DC | PRN
  Administered 2015-03-09: 5 mg via SUBCUTANEOUS
  Filled 2015-03-09: qty 0.5

## 2015-03-09 MED ORDER — OXYTOCIN 40 UNITS IN LACTATED RINGERS INFUSION - SIMPLE MED
62.5000 mL/h | INTRAVENOUS | Status: AC
Start: 2015-03-09 — End: 2015-03-10
  Administered 2015-03-09: 62.5 mL/h via INTRAVENOUS

## 2015-03-09 MED ORDER — PHENYLEPHRINE 8 MG IN D5W 100 ML (0.08MG/ML) PREMIX OPTIME
INJECTION | INTRAVENOUS | Status: DC | PRN
  Administered 2015-03-09: 60 ug/min via INTRAVENOUS

## 2015-03-09 MED ORDER — NALOXONE HCL 1 MG/ML IJ SOLN
1.0000 ug/kg/h | INTRAVENOUS | Status: DC | PRN
  Filled 2015-03-09: qty 2

## 2015-03-09 MED ORDER — KETOROLAC TROMETHAMINE 30 MG/ML IJ SOLN
30.0000 mg | Freq: Four times a day (QID) | INTRAMUSCULAR | Status: AC | PRN
  Administered 2015-03-09: 30 mg via INTRAMUSCULAR

## 2015-03-09 MED ORDER — FENTANYL CITRATE (PF) 100 MCG/2ML IJ SOLN
INTRAMUSCULAR | Status: AC
  Filled 2015-03-09: qty 4

## 2015-03-09 MED ORDER — NALOXONE HCL 0.4 MG/ML IJ SOLN: 0.4000 mg | INTRAMUSCULAR | Status: DC | PRN

## 2015-03-09 MED ORDER — NALBUPHINE HCL 10 MG/ML IJ SOLN
INTRAMUSCULAR | Status: AC
  Administered 2015-03-09: 5 mg via SUBCUTANEOUS
  Filled 2015-03-09: qty 1

## 2015-03-09 MED ORDER — OXYTOCIN 10 UNIT/ML IJ SOLN
40.0000 [IU] | INTRAVENOUS | Status: DC | PRN
  Administered 2015-03-09: 40 [IU] via INTRAVENOUS

## 2015-03-09 MED ORDER — ZOLPIDEM TARTRATE 5 MG PO TABS: 5.0000 mg | ORAL_TABLET | Freq: Every evening | ORAL | Status: DC | PRN

## 2015-03-09 MED ORDER — ONDANSETRON HCL 4 MG/2ML IJ SOLN
INTRAMUSCULAR | Status: DC | PRN
  Administered 2015-03-09: 4 mg via INTRAVENOUS

## 2015-03-09 MED ORDER — ERYTHROMYCIN 5 MG/GM OP OINT
TOPICAL_OINTMENT | OPHTHALMIC | Status: AC
  Filled 2015-03-09: qty 1

## 2015-03-09 MED ORDER — LACTATED RINGERS IV SOLN
INTRAVENOUS | Status: DC | PRN
  Administered 2015-03-09: 08:00:00 via INTRAVENOUS

## 2015-03-09 MED ORDER — PRENATAL MULTIVITAMIN CH
1.0000 | ORAL_TABLET | Freq: Every day | ORAL | Status: DC
  Administered 2015-03-11: 1 via ORAL
  Filled 2015-03-09: qty 1

## 2015-03-09 MED ORDER — MORPHINE SULFATE 0.5 MG/ML IJ SOLN
INTRAMUSCULAR | Status: AC
  Filled 2015-03-09: qty 100

## 2015-03-09 MED ORDER — SCOPOLAMINE 1 MG/3DAYS TD PT72
MEDICATED_PATCH | TRANSDERMAL | Status: AC
  Administered 2015-03-09: 1.5 mg via TRANSDERMAL
  Filled 2015-03-09: qty 1

## 2015-03-09 MED ORDER — NALBUPHINE HCL 10 MG/ML IJ SOLN: 5.0000 mg | Freq: Once | INTRAMUSCULAR | Status: AC | PRN

## 2015-03-09 MED ORDER — DIBUCAINE 1 % RE OINT: 1.0000 "application " | TOPICAL_OINTMENT | RECTAL | Status: DC | PRN

## 2015-03-09 MED ORDER — NALBUPHINE HCL 10 MG/ML IJ SOLN
5.0000 mg | Freq: Once | INTRAMUSCULAR | Status: AC | PRN
  Administered 2015-03-09: 5 mg via INTRAVENOUS
  Filled 2015-03-09: qty 1

## 2015-03-09 MED ORDER — MEPERIDINE HCL 25 MG/ML IJ SOLN: 6.2500 mg | INTRAMUSCULAR | Status: DC | PRN

## 2015-03-09 MED ORDER — MENTHOL 3 MG MT LOZG: 1.0000 | LOZENGE | OROMUCOSAL | Status: DC | PRN

## 2015-03-09 MED ORDER — WITCH HAZEL-GLYCERIN EX PADS: 1.0000 "application " | MEDICATED_PAD | CUTANEOUS | Status: DC | PRN

## 2015-03-09 MED ORDER — CEFAZOLIN SODIUM-DEXTROSE 2-3 GM-% IV SOLR
2.0000 g | INTRAVENOUS | Status: AC
  Administered 2015-03-09: 2 g via INTRAVENOUS

## 2015-03-09 MED ORDER — NALBUPHINE HCL 10 MG/ML IJ SOLN
5.0000 mg | INTRAMUSCULAR | Status: DC | PRN
  Administered 2015-03-09: 5 mg via INTRAVENOUS
  Filled 2015-03-09: qty 1
  Filled 2015-03-09: qty 0.5

## 2015-03-09 MED ORDER — SENNOSIDES-DOCUSATE SODIUM 8.6-50 MG PO TABS
2.0000 | ORAL_TABLET | ORAL | Status: DC
  Administered 2015-03-09 – 2015-03-12 (×3): 2 via ORAL
  Filled 2015-03-09 (×3): qty 2

## 2015-03-09 MED ORDER — KETOROLAC TROMETHAMINE 30 MG/ML IJ SOLN
INTRAMUSCULAR | Status: AC
  Administered 2015-03-09: 30 mg via INTRAMUSCULAR
  Filled 2015-03-09: qty 1

## 2015-03-09 MED ORDER — SODIUM CHLORIDE 0.9 % IJ SOLN
3.0000 mL | INTRAMUSCULAR | Status: DC | PRN
Start: 2015-03-09 — End: 2015-03-12

## 2015-03-09 MED ORDER — SCOPOLAMINE 1 MG/3DAYS TD PT72
1.0000 | MEDICATED_PATCH | Freq: Once | TRANSDERMAL | Status: DC
  Filled 2015-03-09: qty 1

## 2015-03-09 MED ORDER — ONDANSETRON HCL 4 MG/2ML IJ SOLN
INTRAMUSCULAR | Status: AC
  Filled 2015-03-09: qty 2

## 2015-03-09 MED ORDER — FENTANYL CITRATE (PF) 100 MCG/2ML IJ SOLN
INTRAMUSCULAR | Status: DC | PRN
  Administered 2015-03-09: 10 ug via INTRATHECAL

## 2015-03-09 MED ORDER — BUPIVACAINE IN DEXTROSE 0.75-8.25 % IT SOLN
INTRATHECAL | Status: DC | PRN
  Administered 2015-03-09: 1.7 mL via INTRATHECAL

## 2015-03-09 MED ORDER — LACTATED RINGERS IV SOLN
INTRAVENOUS | Status: DC
  Administered 2015-03-09: 18:00:00 via INTRAVENOUS

## 2015-03-09 MED ORDER — OXYCODONE-ACETAMINOPHEN 5-325 MG PO TABS
2.0000 | ORAL_TABLET | ORAL | Status: DC | PRN
  Administered 2015-03-11 – 2015-03-12 (×5): 2 via ORAL
  Filled 2015-03-09 (×5): qty 2

## 2015-03-09 MED ORDER — SCOPOLAMINE 1 MG/3DAYS TD PT72
1.0000 | MEDICATED_PATCH | Freq: Once | TRANSDERMAL | Status: DC
  Administered 2015-03-09: 1.5 mg via TRANSDERMAL

## 2015-03-09 MED ORDER — KETOROLAC TROMETHAMINE 30 MG/ML IJ SOLN: 30.0000 mg | Freq: Four times a day (QID) | INTRAMUSCULAR | Status: AC | PRN

## 2015-03-09 MED ORDER — EPHEDRINE SULFATE 50 MG/ML IJ SOLN
INTRAMUSCULAR | Status: DC | PRN
  Administered 2015-03-09: 5 mg via INTRAVENOUS
  Administered 2015-03-09: 10 mg via INTRAVENOUS

## 2015-03-09 MED ORDER — LANOLIN HYDROUS EX OINT
1.0000 | TOPICAL_OINTMENT | CUTANEOUS | Status: DC | PRN
Start: 2015-03-09 — End: 2015-03-12

## 2015-03-09 MED ORDER — MORPHINE SULFATE (PF) 0.5 MG/ML IJ SOLN
INTRAMUSCULAR | Status: DC | PRN
  Administered 2015-03-09: .2 mg via INTRATHECAL

## 2015-03-09 SURGICAL SUPPLY — 34 items
CLAMP CORD UMBIL (MISCELLANEOUS) ×2 IMPLANT
CLOTH BEACON ORANGE TIMEOUT ST (SAFETY) ×3 IMPLANT
CONTAINER PREFILL 10% NBF 15ML (MISCELLANEOUS) ×4 IMPLANT
DRAPE SHEET LG 3/4 BI-LAMINATE (DRAPES) ×2 IMPLANT
DRSG OPSITE POSTOP 4X10 (GAUZE/BANDAGES/DRESSINGS) ×3 IMPLANT
DURAPREP 26ML APPLICATOR (WOUND CARE) ×3 IMPLANT
ELECT REM PT RETURN 9FT ADLT (ELECTROSURGICAL) ×3
ELECTRODE REM PT RTRN 9FT ADLT (ELECTROSURGICAL) ×1 IMPLANT
EXTRACTOR VACUUM M CUP 4 TUBE (SUCTIONS) IMPLANT
EXTRACTOR VACUUM M CUP 4' TUBE (SUCTIONS)
GLOVE BIO SURGEON STRL SZ8.5 (GLOVE) ×3 IMPLANT
GOWN STRL REUS W/TWL 2XL LVL3 (GOWN DISPOSABLE) ×3 IMPLANT
GOWN STRL REUS W/TWL LRG LVL3 (GOWN DISPOSABLE) ×3 IMPLANT
KIT ABG SYR 3ML LUER SLIP (SYRINGE) IMPLANT
LIQUID BAND (GAUZE/BANDAGES/DRESSINGS) ×1 IMPLANT
NDL HYPO 25X5/8 SAFETYGLIDE (NEEDLE) IMPLANT
NEEDLE HYPO 25X5/8 SAFETYGLIDE (NEEDLE) IMPLANT
NS IRRIG 1000ML POUR BTL (IV SOLUTION) ×3 IMPLANT
PACK C SECTION WH (CUSTOM PROCEDURE TRAY) ×3 IMPLANT
PAD OB MATERNITY 4.3X12.25 (PERSONAL CARE ITEMS) ×3 IMPLANT
PENCIL SMOKE EVAC W/HOLSTER (ELECTROSURGICAL) ×3 IMPLANT
SUT CHROMIC 0 CT 802H (SUTURE) ×3 IMPLANT
SUT CHROMIC 0 MO4 CR (SUTURE) IMPLANT
SUT CHROMIC 1 CTX 36 (SUTURE) ×6 IMPLANT
SUT CHROMIC 2 0 SH (SUTURE) ×3 IMPLANT
SUT GUT PLAIN 0 CT-3 TAN 27 (SUTURE) ×3 IMPLANT
SUT MON AB 4-0 PS1 27 (SUTURE) ×3 IMPLANT
SUT PDS AB 0 CTX 36 PDP370T (SUTURE) IMPLANT
SUT VIC AB 0 CT1 18XCR BRD8 (SUTURE) IMPLANT
SUT VIC AB 0 CT1 8-18 (SUTURE)
SUT VIC AB 0 CTX 36 (SUTURE) ×6
SUT VIC AB 0 CTX36XBRD ANBCTRL (SUTURE) ×2 IMPLANT
TOWEL OR 17X24 6PK STRL BLUE (TOWEL DISPOSABLE) ×3 IMPLANT
TRAY FOLEY CATH SILVER 14FR (SET/KITS/TRAYS/PACK) ×3 IMPLANT

## 2015-03-09 NOTE — Anesthesia Postprocedure Evaluation (Signed)
Anesthesia Post Note  Patient: Martha Stevens  Procedure(s) Performed: Procedure(s) (LRB): REPEAT CESAREAN SECTION WITH BILATERAL TUBAL LIGATION (Bilateral)  Anesthesia type: Epidural  Patient location: Mother/Baby  Post pain: Pain level controlled  Post assessment: Post-op Vital signs reviewed  Last Vitals:  Filed Vitals:   03/09/15 1300  BP: 118/61  Pulse: 79  Temp: 36.6 C  Resp: 18    Post vital signs: Reviewed  Level of consciousness: awake  Complications: No apparent anesthesia complications

## 2015-03-09 NOTE — Transfer of Care (Signed)
Immediate Anesthesia Transfer of Care Note  Patient: Martha Stevens  Procedure(s) Performed: Procedure(s): REPEAT CESAREAN SECTION WITH BILATERAL TUBAL LIGATION (Bilateral)  Patient Location: PACU  Anesthesia Type:Spinal  Level of Consciousness: awake, alert , oriented and patient cooperative  Airway & Oxygen Therapy: Patient Spontanous Breathing  Post-op Assessment: Report given to RN and Post -op Vital signs reviewed and stable  Post vital signs: Reviewed and stable  Last Vitals:  Filed Vitals:   03/09/15 0620  BP: 124/58  Pulse: 89  Temp: 36.7 C  Resp: 18    Complications: No apparent anesthesia complications

## 2015-03-09 NOTE — Progress Notes (Signed)
Called Dr. Malen Gauze regarding pt c/o pain of a 6 since admission to the unit. Patient now requesting more pain medication.  Ordered received to give  Dilaudid now.  Will continue to monitor patient's pain.

## 2015-03-09 NOTE — Anesthesia Postprocedure Evaluation (Signed)
  Anesthesia Post-op Note  Patient: Martha Stevens  Procedure(s) Performed: Procedure(s) (LRB): REPEAT CESAREAN SECTION WITH BILATERAL TUBAL LIGATION (Bilateral)  Patient Location: PACU  Anesthesia Type: Epidural  Level of Consciousness: awake and alert   Airway and Oxygen Therapy: Patient Spontanous Breathing  Post-op Pain: mild  Post-op Assessment: Post-op Vital signs reviewed, Patient's Cardiovascular Status Stable, Respiratory Function Stable, Patent Airway and No signs of Nausea or vomiting  Last Vitals:  Filed Vitals:   03/09/15 1030  BP: 108/55  Pulse: 80  Temp: 36.6 C  Resp: 20    Post-op Vital Signs: stable   Complications: No apparent anesthesia complications

## 2015-03-09 NOTE — Addendum Note (Signed)
Addendum  created 03/09/15 1339 by Jhonnie Garner, CRNA   Modules edited: Notes Section   Notes Section:  File: [id number]

## 2015-03-09 NOTE — H&P (Signed)
  There has been no change in her history and physical since the original dictation 

## 2015-03-09 NOTE — Op Note (Signed)
Preop diagnosis previous cesarean section 3 at 39 weeks and multiparity Postop diagnosis repeat low transverse cesarean section tubal ligation Anesthesia spinal Surgeon Dr. Francoise Ceo First assistant Dr. Angelica Ran Procedure patient placed on the operating table in the supine position abdomen prepped and draped bladder emptied with a Foley catheter a transverse suprapubic incision made through the old scar carried down to the rectus fascia fascia cleaned and incised length of the incision recti muscles retracted laterally peritoneum incised longitudinally transverse incision made on the visceroperitoneum above the bladder and the bladder mobilized inferiorly transverse low uterine incision made the fluid was clear patient delivered of a female Apgar 9 and 9 weighing 8 lbs. 4 oz. the placenta was posterior removed manually uterine cavity clean with   moist laps the uterine incision closed in one layer with continuous suture of #1 chromic hemostasis was satisfactory the right tube grasped in the midportion with a Babcock clamp 0 plain suture placed in the  Meso  salpinx below the portion of tube within the clamp and this  Was  tied an approximately 1 inch  Tube  transected procedure done in a similar fashion on the other side blood loss was 700 cc abdomen closed in layers peritoneum continuous   of 0 chromic fascia continuous with 0 dexon  and the skin closed with  subcuticular stitch of 4-0 Monocryl

## 2015-03-09 NOTE — Anesthesia Preprocedure Evaluation (Signed)
Anesthesia Evaluation  Patient identified by MRN, date of birth, ID band Patient awake    Reviewed: Allergy & Precautions, NPO status , Patient's Chart, lab work & pertinent test results  History of Anesthesia Complications Negative for: history of anesthetic complications  Airway Mallampati: II  TM Distance: >3 FB Neck ROM: Full    Dental no notable dental hx.    Pulmonary neg pulmonary ROS,  breath sounds clear to auscultation  Pulmonary exam normal       Cardiovascular negative cardio ROS Normal cardiovascular examRhythm:Regular Rate:Normal     Neuro/Psych  Headaches, negative psych ROS   GI/Hepatic negative GI ROS, Neg liver ROS,   Endo/Other  Morbid obesity  Renal/GU negative Renal ROS  negative genitourinary   Musculoskeletal negative musculoskeletal ROS (+)   Abdominal   Peds negative pediatric ROS (+)  Hematology negative hematology ROS (+)   Anesthesia Other Findings   Reproductive/Obstetrics (+) Pregnancy 4th repeat C/S                             Anesthesia Physical Anesthesia Plan  ASA: III  Anesthesia Plan: Spinal and Epidural   Post-op Pain Management:    Induction:   Airway Management Planned:   Additional Equipment:   Intra-op Plan:   Post-operative Plan:   Informed Consent: I have reviewed the patients History and Physical, chart, labs and discussed the procedure including the risks, benefits and alternatives for the proposed anesthesia with the patient or authorized representative who has indicated his/her understanding and acceptance.   Dental advisory given  Plan Discussed with: CRNA  Anesthesia Plan Comments:         Anesthesia Quick Evaluation

## 2015-03-09 NOTE — Anesthesia Procedure Notes (Signed)
Epidural Patient location during procedure: OR  Staffing Anesthesiologist: Merrilee Ancona Performed by: anesthesiologist   Preanesthetic Checklist Completed: patient identified, site marked, surgical consent, pre-op evaluation, timeout performed, IV checked, risks and benefits discussed and monitors and equipment checked  Epidural Patient position: sitting Prep: site prepped and draped and DuraPrep Patient monitoring: continuous pulse ox and blood pressure Approach: midline Location: L3-L4 Injection technique: LOR saline  Needle:  Needle type: Tuohy  Needle gauge: 17 G Needle length: 9 cm and 9 Needle insertion depth: 7 cm Catheter type: closed end flexible Catheter size: 19 Gauge Catheter at skin depth: 12 cm Test dose: negative  Assessment Events: blood not aspirated, injection not painful, no injection resistance, negative IV test and no paresthesia  Additional Notes Patient identified. Risks/Benefits/Options discussed with patient including but not limited to bleeding, infection, nerve damage, paralysis, failed block, incomplete pain control, headache, blood pressure changes, nausea, vomiting, reactions to medication both or allergic, itching and postpartum back pain. Confirmed with bedside nurse the patient's most recent platelet count. Confirmed with patient that they are not currently taking any anticoagulation, have any bleeding history or any family history of bleeding disorders. Patient expressed understanding and wished to proceed. All questions were answered. Sterile technique was used throughout the entire procedure. Please see nursing notes for vital signs. Test dose was given through epidural catheter and negative prior to continuing to dose epidural or start infusion.  This was a CSE. After LOR with saline, a 27ga sprotte needle was passed through the tuohy and clear CSF return obtained. The spinal dose was given and needle withdrawn. No heme, no paresthesia, no pain.  The epidural catheter was then threaded easily and secured.

## 2015-03-10 ENCOUNTER — Encounter (HOSPITAL_COMMUNITY): Payer: Self-pay | Admitting: Obstetrics

## 2015-03-10 LAB — CBC
HCT: 26.5 % — ABNORMAL LOW (ref 36.0–46.0)
HEMOGLOBIN: 8.7 g/dL — AB (ref 12.0–15.0)
MCH: 29.1 pg (ref 26.0–34.0)
MCHC: 32.8 g/dL (ref 30.0–36.0)
MCV: 88.6 fL (ref 78.0–100.0)
Platelets: 161 10*3/uL (ref 150–400)
RBC: 2.99 MIL/uL — AB (ref 3.87–5.11)
RDW: 13.9 % (ref 11.5–15.5)
WBC: 8.6 10*3/uL (ref 4.0–10.5)

## 2015-03-10 LAB — BIRTH TISSUE RECOVERY COLLECTION (PLACENTA DONATION)

## 2015-03-10 NOTE — Progress Notes (Signed)
Patient ID: Martha Stevens, female   DOB: 01-17-1986, 29 y.o.   MRN: [id number] Postop day 1 Blood pressure 110 of 83 respiration 18 pulse 76 Abdomen soft Legs negative Doing well

## 2015-03-10 NOTE — Lactation Note (Signed)
This note was copied from the chart of Martha Fleeta Stevens. Lactation Consultation Note Initial visit at 32 hours of age.  Mom reports several good feedings, but baby has been sleepy.  Discussed position options with mom and allowing baby STS for feedings.  Mom is able to demonstrate hand expression with colostrum expressed.  Mom has large nipples, encouraged mom to make sure baby has wide open mouth.  Mom had been pulling back for airspace at baby's nose.  Instructed on deep latch.  Assisted with cross cradle hold. Baby latched after several attempts.  Mom denies pain with latch.  [facility] resources given and discussed.  Encouraged to feed with early cues on demand.  Early newborn behavior discussed.   Mom to call for assist as needed.    Patient Name: Martha Stevens Today's Date: 03/10/2015 Reason for consult: Initial assessment   Maternal Data Has patient been taught Hand Expression?: Yes Does the patient have breastfeeding experience prior to this delivery?: Yes  Feeding Feeding Type: Breast Fed Length of feed:  (several minutes observed)  LATCH Score/Interventions Latch: Repeated attempts needed to sustain latch, nipple held in mouth throughout feeding, stimulation needed to elicit sucking reflex. Intervention(s): Adjust position;Assist with latch;Breast massage;Breast compression  Audible Swallowing: A few with stimulation  Type of Nipple: Everted at rest and after stimulation  Comfort (Breast/Nipple): Soft / non-tender     Hold (Positioning): Assistance needed to correctly position infant at breast and maintain latch. Intervention(s): Breastfeeding basics reviewed;Support Pillows;Position options;Skin to skin  LATCH Score: 7  Lactation Tools Discussed/Used Pump Review: Setup, frequency, and cleaning Initiated by:: JS Date initiated:: 03/10/15   Consult Status Consult Status: Follow-up Date: 03/11/15 Follow-up type: In-patient    Jannifer Rodney 03/10/2015, 4:24  PM

## 2015-03-11 NOTE — Lactation Note (Signed)
This note was copied from the chart of Martha Kilie Stevens. Lactation Consultation Note; Mom breast feeding baby as I went into room. Reports baby last fed 5 hours ago. Reports baby has been nursing for 5 min then off to sleep. Reviewed awakening techniques with mom and baby woke and nursed for 10 more min. Mom reports breasts are feeling fuller today. Reports breast feels softer after nursing. Has manual pump- #27 flange given- encouraged mom to try pump and make sure fit is good before she goes home. Encouraged to try to wake baby every 3 hours for feeding and feed with all feeding cues. No questions at present. To call for assist prn  Patient Name: Martha Stevens Today's Date: 03/11/2015 Reason for consult: Follow-up assessment   Maternal Data Formula Feeding for Exclusion: No Has patient been taught Hand Expression?: Yes Does the patient have breastfeeding experience prior to this delivery?: Yes  Feeding Feeding Type: Breast Fed Length of feed: 15 min  LATCH Score/Interventions Latch: Grasps breast easily, tongue down, lips flanged, rhythmical sucking.  Audible Swallowing: A few with stimulation  Type of Nipple: Everted at rest and after stimulation  Comfort (Breast/Nipple): Soft / non-tender     Hold (Positioning): Assistance needed to correctly position infant at breast and maintain latch. Intervention(s): Breastfeeding basics reviewed  LATCH Score: 8  Lactation Tools Discussed/Used     Consult Status Consult Status: PRN    Pamelia Hoit 03/11/2015, 5:04 PM

## 2015-03-11 NOTE — Progress Notes (Signed)
Patient ID: Martha Stevens, female   DOB: 07-Apr-1986, 29 y.o.   MRN: [id number] Postop day 2 Blood pressure 03/15/1956 respiration 16 pulse 76 afebrile abdomen soft dressing dry lochia moderate Legs negative Doing well

## 2015-03-12 LAB — RAPID HIV SCREEN (HIV 1/2 AB+AG)
HIV 1/2 Antibodies: NONREACTIVE
HIV-1 P24 Antigen - HIV24: NONREACTIVE

## 2015-03-12 NOTE — Discharge Summary (Signed)
Obstetric Discharge Summary Reason for Admission: cesarean section Prenatal Procedures: none Intrapartum Procedures: cesarean: low cervical, transverse Postpartum Procedures: none Complications-Operative and Postpartum: none HEMOGLOBIN  Date Value Ref Range Status  03/10/2015 8.7* 12.0 - 15.0 g/dL Final    Comment:    REPEATED TO VERIFY DELTA CHECK NOTED    HCT  Date Value Ref Range Status  03/10/2015 26.5* 36.0 - 46.0 % Final    Physical Exam:  General: alert Lochia: appropriate Uterine Fundus: firm Incision: healing well DVT Evaluation: No evidence of DVT seen on physical exam.  Discharge Diagnoses: Term Pregnancy-delivered  Discharge Information: Date: 03/12/2015 Activity: pelvic rest Diet: routine Medications: Percocet Condition: improved Instructions: refer to practice specific booklet Discharge to: home Follow-up Information    Follow up with Kathreen Cosier, MD.   Specialty:  Obstetrics and Gynecology   Contact information:   [address] [place] [address] [phone]       Newborn Data: Live born female  Birth Weight: 8 lb 5.3 oz (3780 g) APGAR: 9, 9  Home with mother.  Thermon Zulauf A 03/12/2015, 6:58 AM

## 2015-03-12 NOTE — Progress Notes (Signed)
Patient ID: Martha Stevens, female   DOB: January 29, 1986, 29 y.o.   MRN: [id number] Postop #3 Vital signs normal Fundus firm Lochia moderate Incision clean and dry Legs negative home today

## 2015-03-12 NOTE — Lactation Note (Signed)
This note was copied from the chart of Martha Aamirah Stevens. Lactation Consultation Note Experienced BF mom has large everted bruised nipples w/positional stripes. Tender, comfort gels given. Baby has small mouth for a deep latch, instructed to obtain wide flange and may need to do gentle chin tug. Instructed may need to pump and hand express if can't get baby to open wider for latch and let baby's mouth get bigger to fit better on nipple. Mom stated she had the same thing happen with her others. Mom has a hand pump and know how to do hand expression.  Instructed to call [facility] for questions or appt. If needed.  Patient Name: Martha Stevens Today's Date: 03/12/2015 Reason for consult: Follow-up assessment;Breast/nipple pain   Maternal Data    Feeding Feeding Type: Breast Fed  LATCH Score/Interventions Latch: Grasps breast easily, tongue down, lips flanged, rhythmical sucking.  Audible Swallowing: A few with stimulation Intervention(s): Hand expression;Skin to skin  Type of Nipple: Everted at rest and after stimulation  Comfort (Breast/Nipple): Filling, red/small blisters or bruises, mild/mod discomfort  Problem noted: Mild/Moderate discomfort;Cracked, bleeding, blisters, bruises Interventions  (Cracked/bleeding/bruising/blister): Hand pump;Expressed breast milk to nipple Interventions (Mild/moderate discomfort): Comfort gels;Hand massage;Hand expression  Hold (Positioning): No assistance needed to correctly position infant at breast. Intervention(s): Position options;Support Pillows;Skin to skin;Breastfeeding basics reviewed  LATCH Score: 8  Lactation Tools Discussed/Used Tools: Comfort gels;Pump   Consult Status Consult Status: Complete Date: 03/12/15    Charyl Dancer 03/12/2015, 9:31 AM

## 2015-03-12 NOTE — Discharge Instructions (Signed)
Discharge instructions   You can wash your hair  Shower  Eat what you want  Drink what you want  See me in 6 weeks  Your ankles are going to swell more in the next 2 weeks than when pregnant  No sex for 6 weeks   Klyde Banka A, MD 03/12/2015

## 2015-09-21 ENCOUNTER — Ambulatory Visit (INDEPENDENT_AMBULATORY_CARE_PROVIDER_SITE_OTHER): Payer: Self-pay | Admitting: Certified Nurse Midwife

## 2015-09-21 ENCOUNTER — Encounter: Payer: Self-pay | Admitting: Certified Nurse Midwife

## 2015-09-21 VITALS — BP 120/74 | HR 80 | Temp 98.5°F | Ht 62.0 in | Wt 240.0 lb

## 2015-09-21 DIAGNOSIS — Z01419 Encounter for gynecological examination (general) (routine) without abnormal findings: Secondary | ICD-10-CM

## 2015-09-21 DIAGNOSIS — N939 Abnormal uterine and vaginal bleeding, unspecified: Secondary | ICD-10-CM

## 2015-09-21 DIAGNOSIS — R350 Frequency of micturition: Secondary | ICD-10-CM

## 2015-09-21 DIAGNOSIS — Z9851 Tubal ligation status: Secondary | ICD-10-CM | POA: Insufficient documentation

## 2015-09-21 DIAGNOSIS — N760 Acute vaginitis: Secondary | ICD-10-CM

## 2015-09-21 DIAGNOSIS — E669 Obesity, unspecified: Secondary | ICD-10-CM

## 2015-09-21 LAB — POCT URINALYSIS DIPSTICK
BILIRUBIN UA: NEGATIVE
GLUCOSE UA: NEGATIVE
Ketones, UA: NEGATIVE
Leukocytes, UA: NEGATIVE
NITRITE UA: NEGATIVE
RBC UA: NEGATIVE
Spec Grav, UA: 1.02
Urobilinogen, UA: NEGATIVE
pH, UA: 7

## 2015-09-21 MED ORDER — TRANEXAMIC ACID 650 MG PO TABS: 1300.0000 mg | ORAL_TABLET | Freq: Three times a day (TID) | ORAL | Status: DC

## 2015-09-21 MED ORDER — FLUCONAZOLE 100 MG PO TABS: 100.0000 mg | ORAL_TABLET | Freq: Once | ORAL | Status: DC

## 2015-09-21 NOTE — Progress Notes (Signed)
Patient ID: Martha Stevens, female   DOB: 1986-07-03, 31 y.o.   MRN: [id number]    Subjective:      Martha Stevens is a 30 y.o. female here for a routine exam.  Current complaints: urinary frequency for about 1 month, sometimes has incontinence, denies burning or pain. Periods are monthly with heavy bleeding, soaking pad/hour.  Had BTL with last C-section.   Sexually active.    Not currently breastfeeding.  Is a member of [employer].  Works.  Does not have health insurance, discussed gastric bypass but patient is unable to afford it.  Discussed nutrition consult at [facility].  Good Rx coupon given.  Declines STD screening.    Personal health questionnaire:  Is patient Ashkenazi Jewish, have a family history of breast and/or ovarian cancer: yes maternal mother passed from ovarian CA?  Is there a family history of uterine cancer diagnosed at age < 54, gastrointestinal cancer, urinary tract cancer, family member who is a Personnel officer syndrome-associated carrier: unknown, adopted Is the patient overweight and hypertensive, family history of diabetes, personal history of gestational diabetes, preeclampsia or PCOS: yes Is patient over 47, have PCOS,  family history of premature CHD under age 72, diabetes, smoke, have hypertension or peripheral artery disease:  no At any time, has a partner hit, kicked or otherwise hurt or frightened you?: no Over the past 2 weeks, have you felt down, depressed or hopeless?: no Over the past 2 weeks, have you felt little interest or pleasure in doing things?:no   Gynecologic History Patient's last menstrual period was 08/28/2015. Contraception: tubal ligation Last Pap: unknown. Results were: normal according to the patient Last mammogram: N/A.   Obstetric History OB History  Gravida Para Term Preterm AB SAB TAB Ectopic Multiple Living  # Outcome Date GA Lbr Len/2nd Weight Sex Delivery Anes PTL Lv  5 Term 03/09/15 [redacted]w[redacted]d  8 lb 5.3 oz (3.78 kg) F CS-LTranv EPI  Y  4  Term 06/19/11 [redacted]w[redacted]d  8 lb 5.5 oz (3.785 kg) M CS-LTranv   Y     Comments: normal term AGA female  3 Term 2010    M CS-Unspec   Y  2 Term 2008    M CS-Unspec   Y  1 AB               Past Medical History  Diagnosis Date  . IONGEXBM(841.3)     Past Surgical History  Procedure Laterality Date  . Cesarean section  2008,2010    x3  . Cesarean section  06/19/2011    Procedure: CESAREAN SECTION;  Surgeon: Kathreen Cosier, MD;  Location: [facility];  Service: Gynecology;  Laterality: N/A;  . Cesarean section with bilateral tubal ligation Bilateral 03/09/2015    Procedure: REPEAT CESAREAN SECTION WITH BILATERAL TUBAL LIGATION;  Surgeon: Kathreen Cosier, MD;  Location: [facility] OR[facility];  Service: Obstetrics;  Laterality: Bilateral;     Current outpatient prescriptions:  .  fluconazole (DIFLUCAN) 100 MG tablet, Take 1 tablet (100 mg total) by mouth once. Repeat dose in 48-72 hour., Disp: 3 tablet, Rfl: 0 .  tranexamic acid (LYSTEDA) 650 MG TABS tablet, Take 2 tablets (1,300 mg total) by mouth 3 (three) times daily. For the first five days of your cycle., Disp: 30 tablet, Rfl: 5 No Known Allergies  Social History  Substance Use Topics  . Smoking status: Never Smoker   . Smokeless tobacco: Not on file  .  Alcohol Use: No     Comment: Occassional Use- not while pregnant    Family History  Problem Relation Age of Onset  . Adopted: Yes  . Cancer Mother       Review of Systems  Constitutional: negative for fatigue and weight loss Respiratory: negative for cough and wheezing Cardiovascular: negative for chest pain, fatigue and palpitations Gastrointestinal: negative for abdominal pain and change in bowel habits Musculoskeletal:negative for myalgias Neurological: negative for gait problems and tremors Behavioral/Psych: negative for abusive relationship, depression Endocrine: negative for temperature intolerance   Genitourinary:+ for abnormal menstrual periods, and urinary frequency;  negative for  genital lesions, hot flashes, sexual problems and vaginal discharge Integument/breast: negative for breast lump, breast tenderness, nipple discharge and skin lesion(s)    Objective:       BP 120/74 mmHg  Pulse 80  Temp(Src) 98.5 F (36.9 C)  Ht 5\' 2"  (1.575 m)  Wt 240 lb (108.863 kg)  BMI 43.89 kg/m2  LMP 08/28/2015 General:   alert  Skin:   no rash or abnormalities  Lungs:   clear to auscultation bilaterally  Heart:   regular rate and rhythm, S1, S2 normal, no murmur, click, rub or gallop  Breasts:   normal without suspicious masses, skin or nipple changes or axillary nodes  Abdomen:  normal findings: no organomegaly, soft, non-tender and no hernia  Pelvis:  External genitalia: normal general appearance Urinary system: urethral meatus normal and bladder without fullness, nontender Vaginal: normal without tenderness, induration or masses, + white chunky discharge Cervix: normal appearance Adnexa: normal bimanual exam Uterus: anteverted and non-tender, normal size   Lab Review Urine pregnancy test Labs reviewed yes Radiologic studies reviewed no  50% of 30 min visit spent on counseling and coordination of care.   Assessment:    Healthy female exam.   obese  urinary frequency  yeast vaginitis  AUB  Plan:    Education reviewed: calcium supplements, depression evaluation, low fat, low cholesterol diet, safe sex/STD prevention, self breast exams, skin cancer screening and weight bearing exercise. Contraception: tubal ligation. Follow up in: 1 year.   Meds ordered this encounter  Medications  . tranexamic acid (LYSTEDA) 650 MG TABS tablet    Sig: Take 2 tablets (1,300 mg total) by mouth 3 (three) times daily. For the first five days of your cycle.    Dispense:  30 tablet    Refill:  5  . fluconazole (DIFLUCAN) 100 MG tablet    Sig: Take 1 tablet (100 mg total) by mouth once. Repeat dose in 48-72 hour.    Dispense:  3 tablet    Refill:  0   Orders Placed This  Encounter  Procedures  . POCT urinalysis dipstick   Need to obtain previous records Possible management options include:Kegals for one month then urology consult, nutrition consult, gastric bypass Follow up as needed.

## 2015-09-21 NOTE — Patient Instructions (Signed)
Kegel Exercises  The goal of Kegel exercises is to isolate and exercise your pelvic floor muscles. These muscles act as a hammock that supports the rectum, vagina, small intestine, and uterus. As the muscles weaken, the hammock sags and these organs are displaced from their normal positions. Kegel exercises can strengthen your pelvic floor muscles and help you to improve bladder and bowel control, improve sexual response, and help reduce many problems and some discomfort during pregnancy. Kegel exercises can be done anywhere and at any time.  HOW TO PERFORM KEGEL EXERCISES  1. Locate your pelvic floor muscles. To do this, squeeze (contract) the muscles that you use when you try to stop the flow of urine. You will feel a tightness in the vaginal area (women) and a tight lift in the rectal area (men and women).  2. When you begin, contract your pelvic muscles tight for 2-5 seconds, then relax them for 2-5 seconds. This is one set. Do 4-5 sets with a short pause in between.  3. Contract your pelvic muscles for 8-10 seconds, then relax them for 8-10 seconds. Do 4-5 sets. If you cannot contract your pelvic muscles for 8-10 seconds, try 5-7 seconds and work your way up to 8-10 seconds. Your goal is 4-5 sets of 10 contractions each day.  Keep your stomach, buttocks, and legs relaxed during the exercises. Perform sets of both short and long contractions. Vary your positions. Perform these contractions 3-4 times per day. Perform sets while you are:    · Lying in bed in the morning.  · Standing at lunch.  · Sitting in the late afternoon.  · Lying in bed at night.   You should do 40-50 contractions per day. Do not perform more Kegel exercises per day than recommended. Overexercising can cause muscle fatigue. Continue these exercises for for at least 15-20 weeks or as directed by your caregiver.     This information is not intended to replace advice given to you by your health care provider. Make sure you discuss any questions  you have with your health care provider.     Document Released: 06/11/2012 Document Revised: 07/16/2014 Document Reviewed: 06/11/2012  [employer]vier Interactive Patient Education ©2016 [employer]

## 2015-09-26 LAB — PAP IG (IMAGE GUIDED): PAP Smear Comment: 0

## 2016-03-17 ENCOUNTER — Encounter: Payer: Self-pay | Admitting: *Deleted

## 2016-03-17 LAB — LAB REPORT - SCANNED: PAP SMEAR: NEGATIVE

## 2016-09-21 ENCOUNTER — Ambulatory Visit: Payer: Self-pay | Admitting: Obstetrics

## 2017-11-20 ENCOUNTER — Other Ambulatory Visit: Payer: Self-pay

## 2017-11-20 ENCOUNTER — Encounter: Payer: Self-pay | Admitting: Physician Assistant

## 2017-11-20 ENCOUNTER — Ambulatory Visit (INDEPENDENT_AMBULATORY_CARE_PROVIDER_SITE_OTHER): Payer: Self-pay | Admitting: Physician Assistant

## 2017-11-20 VITALS — BP 116/78 | HR 75 | Temp 99.1°F | Resp 18 | Ht 63.15 in | Wt 237.8 lb

## 2017-11-20 DIAGNOSIS — R05 Cough: Secondary | ICD-10-CM

## 2017-11-20 DIAGNOSIS — W57XXXA Bitten or stung by nonvenomous insect and other nonvenomous arthropods, initial encounter: Secondary | ICD-10-CM

## 2017-11-20 DIAGNOSIS — R059 Cough, unspecified: Secondary | ICD-10-CM

## 2017-11-20 MED ORDER — BENZONATATE 100 MG PO CAPS: 100.0000 mg | ORAL_CAPSULE | Freq: Three times a day (TID) | ORAL | 0 refills | Status: DC | PRN

## 2017-11-20 MED ORDER — DOXYCYCLINE HYCLATE 100 MG PO CAPS: 100.0000 mg | ORAL_CAPSULE | Freq: Two times a day (BID) | ORAL | 0 refills | Status: DC

## 2017-11-20 NOTE — Progress Notes (Signed)
MRN: [id number] DOB: [birth year]  Subjective:   Martha Stevens is a 32 y.o. female presenting for chief complaint of Cough (X 1 week) and Tick Removal (pt states that she removed it from her right side/back- pt states that she has been having flu like symptoms) .  Reports 1 week history of persisting dry cough. Has some scratchy throat. Denies sinus pain, rhinorrhea, ear pain, wheezing, shortness of breath and chest pain . Has had sick contact with anyone No history of seasonal allergies, no history of asthma. Patient has not had flu shot this season. Denies smoking.   Also pulled embedded tick off of her on Friday (5 days ago). Not sure how long it had been on her. Woke up next morning with flu like symptoms, had fever and body aches. Took nyquil with relief. Sx resolved after two days. Has headache and neck pain today. Denies visual disturbance, confusion, gait abnormality, rash, nasuea, vomiting, abdominal pain, and muscle aches. Has tried ibuprofen with no full relief. Has PMH of migraines.  No PMH of lyme disease or RMSF.   Review of Systems  Constitutional: Negative for diaphoresis.  HENT: Negative for congestion and sinus pain.   Cardiovascular: Negative for palpitations and leg swelling.  Neurological: Negative for dizziness, tingling, tremors and weakness.     Martha Stevens has a current medication list which includes the following prescription(s): fluconazole and tranexamic acid. Also has No Known Allergies.  Martha Stevens  has a past medical history of Headache(784.0). Also  has a past surgical history that includes Cesarean section (4098,1191); Cesarean section (06/19/2011); and Cesarean section with bilateral tubal ligation (Bilateral, 03/09/2015).     Social History   Socioeconomic History  . Marital status: Legally Separated    Spouse name: Not on file  . Number of children: 4  . Years of education: Not on file  . Highest education level: Not on file  Occupational History  . Not  on file  Social Needs  . Financial resource strain: Not on file  . Food insecurity:    Worry: Not on file    Inability: Not on file  . Transportation needs:    Medical: Not on file    Non-medical: Not on file  Tobacco Use  . Smoking status: Never Smoker  . Smokeless tobacco: Never Used  Substance and Sexual Activity  . Alcohol use: Yes    Alcohol/week: 0.0 oz    Comment: Occassional Use- not while pregnant  . Drug use: No  . Sexual activity: Yes    Birth control/protection: Surgical  Lifestyle  . Physical activity:    Days per week: Not on file    Minutes per session: Not on file  . Stress: Not on file  Relationships  . Social connections:    Talks on phone: Not on file    Gets together: Not on file    Attends religious service: Not on file    Active member of club or organization: Not on file    Attends meetings of clubs or organizations: Not on file    Relationship status: Not on file  . Intimate partner violence:    Fear of current or ex partner: Not on file    Emotionally abused: Not on file    Physically abused: Not on file    Forced sexual activity: Not on file  Other Topics Concern  . Not on file  Social History Narrative  . Not on file    Objective:   Vitals:  BP 116/78 (BP Location: Right Arm, Patient Position: Sitting, Cuff Size: Large)   Pulse 75   Temp 99.1 F (37.3 C) (Oral)   Resp 18   Ht 5' 3.15" (1.604 m)   Wt 237 lb 12.8 oz (107.9 kg)   LMP 11/04/2017 (Approximate)   SpO2 98%   Breastfeeding? No   BMI 41.92 kg/m   Physical Exam  Constitutional: She is oriented to person, place, and time. She appears well-developed and well-nourished. No distress.  HENT:  Head: Normocephalic and atraumatic.  Right Ear: Tympanic membrane, external ear and ear canal normal.  Left Ear: Tympanic membrane, external ear and ear canal normal.  Nose: Nose normal. Right sinus exhibits no maxillary sinus tenderness and no frontal sinus tenderness. Left sinus  exhibits no maxillary sinus tenderness and no frontal sinus tenderness.  Mouth/Throat: Uvula is midline, oropharynx is clear and moist and mucous membranes are normal. Tonsils are 2+ on the right. Tonsils are 2+ on the left. No tonsillar exudate.  No pain with palpation of bilateral temporal regions.   Eyes: Pupils are equal, round, and reactive to light. Conjunctivae and EOM are normal.  Neck: Normal range of motion and full passive range of motion without pain. No Brudzinski's sign and no Kernig's sign noted.  Cardiovascular: Normal rate, regular rhythm and normal heart sounds.  Pulmonary/Chest: Effort normal and breath sounds normal. She has no decreased breath sounds. She has no wheezes. She has no rhonchi. She has no rales.  Neurological: She is alert and oriented to person, place, and time. She has normal strength and normal reflexes. No cranial nerve deficit or sensory deficit. She displays a negative Romberg sign. Gait normal.  Normal FNF test.    Skin: Skin is warm and dry. No rash noted.     Psychiatric: She has a normal mood and affect.  Vitals reviewed.   No results found for this or any previous visit (from the past 24 hour(s)).  Assessment and Plan :  1. Cough Lungs CTAB. Likely viral in etiology d/t reassuring physical exam findings.  - Advised supportive care, offered symptomatic relief. Am covering tick bite with doxy, which will also cover for atypical lung bacteria.  - Return to clinic if symptoms worsen or fail to improve in 5-7 days, otherwise return to clinic as needed. - benzonatate (TESSALON) 100 MG capsule; Take 1-2 capsules (100-200 mg total) by mouth 3 (three) times daily as needed for cough.  Dispense: 40 capsule; Refill: 0  2. Tick bite, initial encounter No acute findings on PE. Vitals stable. Will tx empirically for underlying tick borne illness at this time with doxy. Labs pending. Given strict return/ED precautions.  - CBC with Differential/Platelet -  Rickettsial Fever Group IgG/M - B. Burgdorfi Antibodies - doxycycline (VIBRAMYCIN) 100 MG capsule; Take 1 capsule (100 mg total) by mouth 2 (two) times daily.  Dispense: 20 capsule; Refill: 0 - Care order/instruction:   Benjiman Core, PA-C  [facility] at [facility] 11/20/2017 2:45 PM

## 2017-11-20 NOTE — Patient Instructions (Addendum)
For cough, take tessalon perles during day and nyquil at night.   I have given a prescription for doxycycline, which is an antibiotic that will cover for any tick borne illness and for underlying lung bacteria. Please take as prescribed.    While taking Doxycycline:  -Do not drink milk or take iron supplements, multivitamins, calcium supplements, antacids, laxatives within 2 hours before or after taking doxycycline. -Avoid direct exposure to sunlight or tanning beds. Doxycycline can make you sunburn more easily. Wear protective clothing and use sunscreen (SPF 30 or higher) when you are outdoors. -Antibiotic medicines can cause diarrhea, which may be a sign of a new infection. If you have diarrhea that is watery or bloody, stop taking this medicine and seek medical care.    You may use ibuprofen 600-800mg  every 8 hours for headache.   Return to clinic if symptoms worsen, do not improve, or as needed   IF you received an x-ray today, you will receive an invoice from [facility]. Please contact [facility] at [phone] with questions or concerns regarding your invoice.   IF you received labwork today, you will receive an invoice from [facility]. Please contact [facility] at [phone] with questions or concerns regarding your invoice.   Our billing staff will not be able to assist you with questions regarding bills from these companies.  You will be contacted with the lab results as soon as they are available. The fastest way to get your results is to activate your My Chart account. Instructions are located on the last page of this paperwork. If you have not heard from [place] regarding the results in 2 weeks, please contact this office.     Tick Bite Information, Adult Ticks are insects that can bite. Most ticks live in shrubs and grassy areas. They climb onto people and animals that go by. Then they bite. Some ticks carry germs that can make you sick. How can I prevent tick  bites?  Use an insect repellent that has 20% or higher of the ingredients DEET, picaridin, or IR3535. Put this insect repellent on: ? Bare skin. ? The tops of your boots. ? Your pant legs. ? The ends of your sleeves.  If you use an insect repellent that has the ingredient permethrin, make sure to follow the instructions on the bottle. Treat the following: ? Clothing. ? Supplies. ? Boots. ? Tents.  Wear long sleeves, long pants, and light colors.  Tuck your pant legs into your socks.  Stay in the middle of the trail.  Try not to walk through long grass.  Before going inside your house, check your clothes, hair, and skin for ticks. Make sure to check your head, neck, armpits, waist, groin, and joint areas.  Check for ticks every day.  When you come indoors: ? Wash your clothes right away. ? Shower right away. ? Dry your clothes in a dryer on high heat for 60 minutes or more. What is the right way to remove a tick? Remove a tick from your skin as soon as possible.  To remove a tick that is crawling on your skin: ? Go outdoors and brush the tick off. ? Use tape or a lint roller.  To remove a tick that is biting: ? Wash your hands. ? If you have latex gloves, put them on. ? Use tweezers, curved forceps, or a tick-removal tool to grasp the tick. Grasp the tick as close to your skin and as close to the tick's head as  possible. ? Gently pull up until the tick lets go.  Try to keep the tick's head attached to its body.  Do not twist or jerk the tick.  Do not squeeze or crush the tick.  Do not try to remove a tick with heat, alcohol, petroleum jelly, or fingernail polish. How should I get rid of a tick? Here are some ways to get rid of a tick that is alive:  Place the tick in rubbing alcohol.  Place the tick in a bag or container you can close tightly.  Wrap the tick tightly in tape.  Flush the tick down the toilet.  Contact a doctor if:  You have symptoms of a  disease, such as: ? Pain in a muscle, joint, or bone. ? Trouble walking or moving your legs. ? Numbness in your legs. ? Inability to move (paralysis). ? A red rash that makes a circle (bull's-eye rash). ? Redness and swelling where the tick bit you. ? A fever. ? Throwing up (vomiting) over and over. ? Diarrhea. ? Weight loss. ? Tender and swollen lymph glands. ? Shortness of breath. ? Cough. ? Belly pain (abdominal pain). ? Headache. ? Being more tired than normal. ? A change in how alert (conscious) you are. ? Confusion. Get help right away if:  You cannot remove a tick.  A part of a tick breaks off and gets stuck in your skin.  You are feeling worse. Summary  Ticks may carry germs that can make you sick.  To prevent tick bites, wear long sleeves, long pants, and light colors. Use insect repellent. Follow the instructions on the bottle.  If the tick is biting, do not try to remove it with heat, alcohol, petroleum jelly, or fingernail polish.  Use tweezers, curved forceps, or a tick-removal tool to grasp the tick. Gently pull up until the tick lets go. Do not twist or jerk the tick. Do not squeeze or crush the tick.  If you have symptoms, contact a doctor. This information is not intended to replace advice given to you by your health care provider. Make sure you discuss any questions you have with your health care provider. Document Released: 09/19/2009 Document Revised: 10/05/2016 Document Reviewed: 10/05/2016 Elsevier Interactive Patient Education  2018 [employer].   Acute Bronchitis, Adult Acute bronchitis is when air tubes (bronchi) in the lungs suddenly get swollen. The condition can make it hard to breathe. It can also cause these symptoms:  A cough.  Coughing up clear, yellow, or green mucus.  Wheezing.  Chest congestion.  Shortness of breath.  A fever.  Body aches.  Chills.  A sore throat.  Follow these instructions at home: Medicines  Take  over-the-counter and prescription medicines only as told by your doctor.  If you were prescribed an antibiotic medicine, take it as told by your doctor. Do not stop taking the antibiotic even if you start to feel better. General instructions  Rest.  Drink enough fluids to keep your pee (urine) clear or pale yellow.  Avoid smoking and secondhand smoke. If you smoke and you need help quitting, ask your doctor. Quitting will help your lungs heal faster.  Use an inhaler, cool mist vaporizer, or humidifier as told by your doctor.  Keep all follow-up visits as told by your doctor. This is important. How is this prevented? To lower your risk of getting this condition again:  Wash your hands often with soap and water. If you cannot use soap and water, use  hand sanitizer.  Avoid contact with people who have cold symptoms.  Try not to touch your hands to your mouth, nose, or eyes.  Make sure to get the flu shot every year.  Contact a doctor if:  Your symptoms do not get better in 2 weeks. Get help right away if:  You cough up blood.  You have chest pain.  You have very bad shortness of breath.  You become dehydrated.  You faint (pass out) or keep feeling like you are going to pass out.  You keep throwing up (vomiting).  You have a very bad headache.  Your fever or chills gets worse. This information is not intended to replace advice given to you by your health care provider. Make sure you discuss any questions you have with your health care provider. Document Released: 12/12/2007 Document Revised: 02/01/2016 Document Reviewed: 12/14/2015 Elsevier Interactive Patient Education  [employer].

## 2017-11-21 LAB — CBC WITH DIFFERENTIAL/PLATELET
BASOS: 0 %
Basophils Absolute: 0 10*3/uL (ref 0.0–0.2)
EOS (ABSOLUTE): 0.2 10*3/uL (ref 0.0–0.4)
EOS: 2 %
HEMOGLOBIN: 12.3 g/dL (ref 11.1–15.9)
Hematocrit: 39.1 % (ref 34.0–46.6)
IMMATURE GRANULOCYTES: 0 %
Immature Grans (Abs): 0 10*3/uL (ref 0.0–0.1)
Lymphocytes Absolute: 2.9 10*3/uL (ref 0.7–3.1)
Lymphs: 33 %
MCH: 27.4 pg (ref 26.6–33.0)
MCHC: 31.5 g/dL (ref 31.5–35.7)
MCV: 87 fL (ref 79–97)
Monocytes Absolute: 0.8 10*3/uL (ref 0.1–0.9)
Monocytes: 10 %
Neutrophils Absolute: 4.9 10*3/uL (ref 1.4–7.0)
Neutrophils: 55 %
PLATELETS: 179 10*3/uL (ref 150–379)
RBC: 4.49 x10E6/uL (ref 3.77–5.28)
RDW: 15 % (ref 12.3–15.4)
WBC: 8.8 10*3/uL (ref 3.4–10.8)

## 2017-11-21 LAB — RICKETTSIAL FEVER GROUP IGG/M
Spotted Fever Group IgG: <1:64 {titer}
Spotted Fever Group IgM: <1:64 {titer}
Typhus Fever Group IgG: <1:64 {titer}
Typhus Fever Group IgM: <1:64 {titer}

## 2017-11-21 LAB — B. BURGDORFI ANTIBODIES: Lyme IgG/IgM Ab: <0.91 {ISR} (ref 0.00–0.90)

## 2019-03-17 ENCOUNTER — Encounter (HOSPITAL_COMMUNITY): Payer: Self-pay | Admitting: Emergency Medicine

## 2019-03-17 ENCOUNTER — Ambulatory Visit (HOSPITAL_COMMUNITY)
Admission: EM | Admit: 2019-03-17 | Discharge: 2019-03-17 | Disposition: A | Discharge status: Home / Self Care | Payer: Self-pay | Attending: Family Medicine | Admitting: Family Medicine

## 2019-03-17 ENCOUNTER — Other Ambulatory Visit: Payer: Self-pay

## 2019-03-17 DIAGNOSIS — K529 Noninfective gastroenteritis and colitis, unspecified: Secondary | ICD-10-CM

## 2019-03-17 DIAGNOSIS — Z3202 Encounter for pregnancy test, result negative: Secondary | ICD-10-CM

## 2019-03-17 DIAGNOSIS — R112 Nausea with vomiting, unspecified: Secondary | ICD-10-CM

## 2019-03-17 LAB — POCT URINALYSIS DIP (DEVICE)
Bilirubin Urine: NEGATIVE
Glucose, UA: NEGATIVE mg/dL
Ketones, ur: NEGATIVE mg/dL
Nitrite: NEGATIVE
Protein, ur: NEGATIVE mg/dL
Specific Gravity, Urine: >=1.03 (ref 1.005–1.030)
Urobilinogen, UA: 0.2 mg/dL (ref 0.0–1.0)
pH: 6 (ref 5.0–8.0)

## 2019-03-17 LAB — POCT PREGNANCY, URINE: Preg Test, Ur: NEGATIVE

## 2019-03-17 MED ORDER — ONDANSETRON 8 MG PO TBDP: 8.0000 mg | ORAL_TABLET | Freq: Three times a day (TID) | ORAL | 0 refills | Status: DC | PRN

## 2019-03-17 MED ORDER — LOPERAMIDE HCL 2 MG PO CAPS: 2.0000 mg | ORAL_CAPSULE | Freq: Every day | ORAL | 0 refills | Status: DC | PRN

## 2019-03-17 NOTE — ED Triage Notes (Signed)
Pt sts fever last week that is now resolved; pt sts still having diarrhea and noted possible hematuria; pt declines covid test until seen by provider

## 2019-03-17 NOTE — ED Provider Notes (Signed)
MRN: [id number] DOB: [birth year]  Subjective:   Martha Stevens is a 33 y.o. female presenting for 1 week history of fever, malaise, diarrhea/loose stools, mild hematuria, n/v, headaches. Her fever and BM's have improved, has not had fevers this week and vomiting resolved 5 days ago. BM's are down to ~1/day. No known COVID 19 contacts. Had LMP last week, was lighter than normal. Has had tubal ligation.  Patient states that her boss was worried she might have COVID-19 but she is not really worried about this.  She states that she looked up online what could happen with the use of tampons and found TSS is a possibility.  She is worried about this.  She is not currently taking medications.   No Known Allergies  Past Medical History:  Diagnosis Date  . BMWUXLKG(401.0Headache(784.0)      Past Surgical History:  Procedure Laterality Date  . CESAREAN SECTION  [id number],2010   x3  . CESAREAN SECTION  06/19/2011   Procedure: CESAREAN SECTION;  Surgeon: Kathreen CosierBernard A Marshall, MD;  Location: [facility];  Service: Gynecology;  Laterality: N/A;  . CESAREAN SECTION WITH BILATERAL TUBAL LIGATION Bilateral 03/09/2015   Procedure: REPEAT CESAREAN SECTION WITH BILATERAL TUBAL LIGATION;  Surgeon: Kathreen CosierBernard A Marshall, MD;  Location: [facility];  Service: Obstetrics;  Laterality: Bilateral;   ROS  Objective:   Vitals: BP 126/81 (BP Location: Right Arm)   Pulse 65   Temp 98.3 F (36.8 C) (Temporal)   Resp 18   SpO2 99%   Physical Exam Constitutional:      General: She is not in acute distress.    Appearance: Normal appearance. She is well-developed. She is obese. She is not ill-appearing, toxic-appearing or diaphoretic.  HENT:     Head: Normocephalic and atraumatic.     Right Ear: External ear normal.     Left Ear: External ear normal.     Nose: Nose normal.     Mouth/Throat:     Mouth: Mucous membranes are moist.     Pharynx: Oropharynx is clear.  Eyes:     General: No scleral icterus.    Extraocular Movements: Extraocular  movements intact.     Pupils: Pupils are equal, round, and reactive to light.  Cardiovascular:     Rate and Rhythm: Normal rate and regular rhythm.     Pulses: Normal pulses.     Heart sounds: Normal heart sounds. No murmur. No friction rub. No gallop.   Pulmonary:     Effort: Pulmonary effort is normal. No respiratory distress.     Breath sounds: Normal breath sounds. No stridor. No wheezing, rhonchi or rales.  Abdominal:     General: Bowel sounds are normal. There is no distension.     Palpations: Abdomen is soft. There is no mass.     Tenderness: There is no abdominal tenderness. There is no right CVA tenderness, left CVA tenderness, guarding or rebound.  Skin:    General: Skin is warm and dry.     Coloration: Skin is not pale.     Findings: No rash.  Neurological:     General: No focal deficit present.     Mental Status: She is alert and oriented to person, place, and time.  Psychiatric:        Mood and Affect: Mood normal.        Behavior: Behavior normal.        Thought Content: Thought content normal.        Judgment: Judgment normal.  Results for orders placed or performed during the hospital encounter of 03/17/19 (from the past 24 hour(s))  POCT urinalysis dip (device)     Status: Abnormal   Collection Time: 03/17/19  5:23 PM  Result Value Ref Range   Glucose, UA NEGATIVE NEGATIVE mg/dL   Bilirubin Urine NEGATIVE NEGATIVE   Ketones, ur NEGATIVE NEGATIVE mg/dL   Specific Gravity, Urine >=1.030 1.005 - 1.030   Hgb urine dipstick TRACE (A) NEGATIVE   pH 6.0 5.0 - 8.0   Protein, ur NEGATIVE NEGATIVE mg/dL   Urobilinogen, UA 0.2 0.0 - 1.0 mg/dL   Nitrite NEGATIVE NEGATIVE   Leukocytes,Ua TRACE (A) NEGATIVE    Assessment and Plan :   1. Gastroenteritis   2. Nausea vomiting and diarrhea     Very low suspicion for TSS, counseled patient and reassured her on her good physical exam and reassuring vital signs.  Will manage for viral gastroenteritis. Recommended  patient hydrate well, eat light meals and maintain electrolytes.  Will use Zofran and Imodium for nausea, vomiting and diarrhea.  Patient refused COVID-19 testing.  Counseled patient on potential for adverse effects with medications prescribed/recommended today, ER and return-to-clinic precautions discussed, patient verbalized understanding.    Jaynee Eagles, [place] 03/17/19 1752

## 2019-11-09 ENCOUNTER — Other Ambulatory Visit: Payer: Self-pay

## 2019-11-09 ENCOUNTER — Emergency Department (HOSPITAL_COMMUNITY)
Admission: EM | Admit: 2019-11-09 | Discharge: 2019-11-09 | Disposition: A | Discharge status: Home / Self Care | Payer: Self-pay | Attending: Emergency Medicine | Admitting: Emergency Medicine

## 2019-11-09 ENCOUNTER — Encounter (HOSPITAL_COMMUNITY): Payer: Self-pay | Admitting: Emergency Medicine

## 2019-11-09 DIAGNOSIS — N3000 Acute cystitis without hematuria: Secondary | ICD-10-CM | POA: Insufficient documentation

## 2019-11-09 LAB — URINALYSIS, ROUTINE W REFLEX MICROSCOPIC
Bilirubin Urine: NEGATIVE
Glucose, UA: NEGATIVE mg/dL
Hgb urine dipstick: NEGATIVE
Ketones, ur: NEGATIVE mg/dL
Nitrite: NEGATIVE
Protein, ur: NEGATIVE mg/dL
Specific Gravity, Urine: 1.009 (ref 1.005–1.030)
pH: 6 (ref 5.0–8.0)

## 2019-11-09 LAB — PREGNANCY, URINE: Preg Test, Ur: NEGATIVE

## 2019-11-09 MED ORDER — CEPHALEXIN 500 MG PO CAPS
500.0000 mg | ORAL_CAPSULE | Freq: Three times a day (TID) | ORAL | 0 refills | Status: DC
Start: 2019-11-09 — End: 2019-11-12

## 2019-11-09 MED ORDER — CEPHALEXIN 500 MG PO CAPS
500.0000 mg | ORAL_CAPSULE | Freq: Once | ORAL | Status: AC
  Administered 2019-11-09: 07:00:00 500 mg via ORAL
  Filled 2019-11-09: qty 1

## 2019-11-09 NOTE — Discharge Instructions (Signed)
Take the prescribed medication as directed.  Make sure to drink lots of water, cotton underwear, etc like we discussed. Keep your eye on the leg-- monitor for redness, warmth to touch, streaking of the leg, etc. Follow-up with GYN on Thursday as scheduled. Return to the ED for new or worsening symptoms.

## 2019-11-09 NOTE — ED Triage Notes (Signed)
Patient is complaining of abscess on inner thigh. This has been going on for a few months. Patient states she has no other symptoms.

## 2019-11-09 NOTE — ED Provider Notes (Signed)
[facility]-EMERGENCY DEPT Provider Note   CSN: [id number] Arrival date & time: 11/09/19  0214     History Chief Complaint  Patient presents with  . Abscess    Martha Stevens is a 34 y.o. female.  The history is provided by the patient and medical records.    34 year old female with history of headaches, obesity, presenting to the ED with a "knot" in her right medial thigh that she felt last night while in the shower.  States she is overweight and her thighs tend to rub together, sometimes she does have some chafing.  States she has never felt a "lump" in this area before.  She denies any noted external redness, swelling, or induration.  No recent fever or chills.  States she has had some lower abdominal pressure for quite some time and always has had urinary frequency.  She denies any dysuria.  No vaginal discharge.  No irregular bleeding.  She is status post c-section x4.  Past Medical History:  Diagnosis Date  . HERDEYCX(448.1)     Patient Active Problem List   Diagnosis Date Noted  . Obesity 09/21/2015  . History of bilateral tubal ligation 09/21/2015  . S/P cesarean section 03/09/2015  . EHUDJSHF(026.3)     Past Surgical History:  Procedure Laterality Date  . CESAREAN SECTION  [id number]   x3  . CESAREAN SECTION  06/19/2011   Procedure: CESAREAN SECTION;  Surgeon: Kathreen Cosier, MD;  Location: [facility];  Service: Gynecology;  Laterality: N/A;  . CESAREAN SECTION WITH BILATERAL TUBAL LIGATION Bilateral 03/09/2015   Procedure: REPEAT CESAREAN SECTION WITH BILATERAL TUBAL LIGATION;  Surgeon: Kathreen Cosier, MD;  Location: [facility];  Service: Obstetrics;  Laterality: Bilateral;     OB History    Gravida  5   Para  4   Term  4   Preterm      AB  1   Living  4     SAB      TAB      Ectopic      Multiple      Live Births  4           Family History  Adopted: Yes  Problem Relation Age of Onset  . Cancer Mother     Social  History   Tobacco Use  . Smoking status: Never Smoker  . Smokeless tobacco: Never Used  Substance Use Topics  . Alcohol use: Yes    Alcohol/week: 0.0 standard drinks    Comment: Occassional Use- not while pregnant  . Drug use: No    Home Medications Prior to Admission medications   Medication Sig Start Date End Date Taking? Authorizing Provider  loperamide (IMODIUM) 2 MG capsule Take 1 capsule (2 mg total) by mouth daily as needed for diarrhea or loose stools. 03/17/19   Wallis Bamberg, PA-C  ondansetron (ZOFRAN-ODT) 8 MG disintegrating tablet Take 1 tablet (8 mg total) by mouth every 8 (eight) hours as needed for nausea or vomiting. 03/17/19   Wallis Bamberg, PA-C    Allergies    Patient has no known allergies.  Review of Systems   Review of Systems  Skin:       Lump on thigh  All other systems reviewed and are negative.   Physical Exam Updated Vital Signs BP 127/81 (BP Location: Left Arm)   Pulse 78   Temp 98.8 F (37.1 C) (Oral)   Resp 16   Ht 5\' 2"  (1.575 m)  Wt 106.1 kg   LMP 10/12/2019   SpO2 99%   BMI 42.80 kg/m   Physical Exam Vitals and nursing note reviewed.  Constitutional:      Appearance: She is well-developed.  HENT:     Head: Normocephalic and atraumatic.  Eyes:     Conjunctiva/sclera: Conjunctivae normal.     Pupils: Pupils are equal, round, and reactive to light.  Cardiovascular:     Rate and Rhythm: Normal rate and regular rhythm.     Heart sounds: Normal heart sounds.  Pulmonary:     Effort: Pulmonary effort is normal.     Breath sounds: Normal breath sounds.  Abdominal:     General: Bowel sounds are normal.     Palpations: Abdomen is soft.  Musculoskeletal:        General: Normal range of motion.     Cervical back: Normal range of motion.     Comments: Right thigh with large collection of adipose tissue along medial thigh, there is a skin fold approx mid-thigh secondary to this; I do not appreciate any noted lump/bulge beneath the skin; no  overlying erythema, induration, or cellulitis; no streaking of the leg Left leg appears symmetric to right with same pattern of excess adipose tissue and skin fold along mid thigh  Skin:    General: Skin is warm and dry.  Neurological:     Mental Status: She is alert and oriented to person, place, and time.     ED Results / Procedures / Treatments   Labs (all labs ordered are listed, but only abnormal results are displayed) Labs Reviewed  URINALYSIS, ROUTINE W REFLEX MICROSCOPIC  PREGNANCY, URINE    EKG None  Radiology No results found.  Procedures Procedures (including critical care time)  EMERGENCY DEPARTMENT US SOFT TISSUE INTERPRETATION "Study: Limited Soft Tissue Ultrasound"  INDICATIONS: right medial thigh Multiple views of the body part were obtained in real-time with a multi-frequency linear probe  PERFORMED BY: Myself IMAGES ARCHIVED?: Yes SIDE:Right  BODY PART:Lower extremity INTERPRETATION:  No abcess noted   Medications Ordered in ED Medications - No data to display  ED Course  I have reviewed the triage vital signs and the nursing notes.  Pertinent labs & imaging results that were available during my care of the patient were reviewed by me and considered in my medical decision making (see chart for details).    MDM Rules/Calculators/A&P  34 year old female presenting to the ED with concern of "lump" in her right thigh that she noticed while showering earlier this evening.  On exam, she does have a large amount of adipose tissue collected in the right medial thigh with a skin fold along mid thigh.  There is no appreciable abscess, erythema, induration, or streaking up the leg.  Left leg with symmetric pattern of adipose tissue.  Bedside ultrasound was performed of this area, no abscess or fluid collection noted beneath the skin.  I recommended that she keep an eye on this and follow-up with her primary care doctor if seeming to worsen.  Patient also  reported urinary frequency ongoing for "a while".  No vaginal discharge.  UA here with many bacteria, will treat for uncomplicated UTI.  She has follow-up with OB/GYN on Thursday, they may want to recheck her urine then to ensure this is improving.  She may return here for any new or acute changes.  Final Clinical Impression(s) / ED Diagnoses Final diagnoses:  Acute cystitis without hematuria    Rx / DC Orders  ED Discharge Orders         Ordered    cephALEXin (KEFLEX) 500 MG capsule  3 times daily     11/09/19 0615           Larene Pickett, PA-C 11/09/19 [id number]    Ezequiel Essex, MD 11/09/19 [phone]

## 2019-11-12 ENCOUNTER — Other Ambulatory Visit (HOSPITAL_COMMUNITY)
Admission: RE | Admit: 2019-11-12 | Discharge: 2019-11-12 | Disposition: A | Discharge status: Home / Self Care | Payer: Self-pay | Source: Ambulatory Visit | Attending: Obstetrics & Gynecology | Admitting: Obstetrics & Gynecology

## 2019-11-12 ENCOUNTER — Other Ambulatory Visit: Payer: Self-pay

## 2019-11-12 ENCOUNTER — Ambulatory Visit (INDEPENDENT_AMBULATORY_CARE_PROVIDER_SITE_OTHER): Payer: Self-pay | Admitting: Obstetrics & Gynecology

## 2019-11-12 ENCOUNTER — Encounter: Payer: Self-pay | Admitting: Obstetrics & Gynecology

## 2019-11-12 VITALS — BP 136/83 | HR 65 | Wt 266.0 lb

## 2019-11-12 DIAGNOSIS — E66813 Obesity, class 3: Secondary | ICD-10-CM

## 2019-11-12 DIAGNOSIS — Z01419 Encounter for gynecological examination (general) (routine) without abnormal findings: Secondary | ICD-10-CM | POA: Insufficient documentation

## 2019-11-12 DIAGNOSIS — R32 Unspecified urinary incontinence: Secondary | ICD-10-CM

## 2019-11-12 DIAGNOSIS — Z6841 Body Mass Index (BMI) 40.0 and over, adult: Secondary | ICD-10-CM

## 2019-11-12 DIAGNOSIS — R109 Unspecified abdominal pain: Secondary | ICD-10-CM

## 2019-11-12 DIAGNOSIS — Z1272 Encounter for screening for malignant neoplasm of vagina: Secondary | ICD-10-CM

## 2019-11-12 DIAGNOSIS — G43A1 Cyclical vomiting, intractable: Secondary | ICD-10-CM

## 2019-11-12 DIAGNOSIS — Z124 Encounter for screening for malignant neoplasm of cervix: Secondary | ICD-10-CM

## 2019-11-12 NOTE — Patient Instructions (Signed)
Preventing Health Risks of Being Overweight Maintaining a healthy body weight is an important part of your overall health. Your healthy body weight depends on your age, gender, and height. Being overweight puts you at risk for many health problems, including:  Heart disease.  Diabetes.  Problems sleeping.  Joint problems. You can make changes to your diet and lifestyle to prevent these risks. Consider working with a health care provider or a dietitian to make these changes. What nutrition changes can be made?   Eat only as much as your body needs. In most cases, this is about 2,000 calories a day, but the amount varies depending on your height, gender, and activity level. Ask your health care provider how many calories you should have each day. Eating more than your body needs on a regular basis can cause you to become overweight or obese.  Eat slowly, and stop eating when you feel full.  Choose healthy foods, including: ? Fruits and vegetables. ? Lean meats. ? Low-fat dairy products. ? High-fiber foods, such as whole grains and beans. ? Healthy snacks like vegetable sticks, a piece of fruit, or a small amount of yogurt or cheese.  Avoid foods and drinks that are high in sugar, salt (sodium), saturated fat, or trans fat. This includes: ? Many desserts such as candy, cookies, and ice cream. ? Soda. ? Fried foods. ? Processed meats such as hot dogs or lunch meats. ? Prepackaged snack foods. What lifestyle changes can be made?   Exercise for at least 150 minutes a week to prevent weight gain, or as often as recommended by your health care provider. Do moderate-intensity exercise, such as brisk walking. ? Spread it out by exercising for 30 minutes 5 days a week, or in short 10-minute bursts several times a day.  Find other ways to stay active and burn calories, such as yard work or a hobby that involves physical activity.  Get at least 8 hours of sleep each night. When you are  well-rested, you are more likely to be active and make healthy choices during the day. To sleep better: ? Try to go to bed and wake up at about the same time every day. ? Keep your bedroom dark, quiet, and cool. ? Make sure that your bed is comfortable. ? Avoid stimulating activities, such as watching television or exercising, for at least one hour before bedtime. Why are these changes important? Eating healthy and being active helps you lose weight and prevent health problems caused by being overweight. Making these changes can also help you manage stress, feel better mentally, and connect with friends and family. What can happen if changes are not made? Being overweight can affect you for your entire life. You may develop joint or bone problems that make it painful or difficult for you to play sports or do activities you enjoy. Being overweight puts stress on your heart and lungs and can lead to medical problems like diabetes, heart disease, and sleeping problems. Where to find support You can get support for preventing health risks of being overweight from:  Your health care provider or a dietitian. They can provide guidance about healthy eating and healthy lifestyle choices.  Weight loss support groups, online or in-person. Where to find more information  MyPlate: www.choosemyplate.gov ? This an online tool that provides personalized recommendations about foods to eat each day.  The Centers for Disease Control and Prevention: www.cdc.gov/healthyweight ? This resource gives tips for managing weight and having an active lifestyle.   Summary  To prevent unhealthy weight gain, it is important to maintain a healthy diet high in vegetables and whole grains, exercise regularly, and get at least 8 hours of sleep each night.  Making these changes helps prevent many long-term (chronic) health conditions that can shorten your life, such as diabetes, heart disease, and stroke. This information is  not intended to replace advice given to you by your health care provider. Make sure you discuss any questions you have with your health care provider. Document Revised: 03/18/2019 Document Reviewed: 05/22/2017 Elsevier Patient Education  2020 Elsevier Inc.  

## 2019-11-12 NOTE — Progress Notes (Addendum)
History:  Ms. Martha Stevens is a 34 y.o. [id number] who presents to clinic today to establish gynecologic care.   She has not had a Pap Smear in 3 years and has been deferring health maintenance for several years. She does not have a PCP, but would be interested in establishing care. She is also interested in beginning a weight loss regimen to keep herself healthy "for my four kids." She reports having tried many diets in the past and exercising at the [facility] regularly, but she finds that she has trouble exercising for more than five minutes at a time due to shortness of breath.  She has been having muscle spasm pains in her abdomen for several months now. They are a 9/10 pain that lasts for approximately 10 seconds and feels like a "Charlie Horse." They resolve when she "massages them out."  She has also been having cyclic episodes of headache and fatigue, along with nausea/vomiting/diarrhea that occur every four weeks or so. These started in September when she had suspected contact with COVID, but she was never tested. She reports a day or two of fever and headache followed by four to five days of n/v/d and fatigue. She has not had any symptoms since completing her COVID vaccination course.   She has been having some urinary urge incontinence and polyuria for the past several weeks. She was diagnosed with a UTI on 5/3 and was prescribed Keflex, but has not picked this up. She was told that she had a "weak pelvis" when she was pregnant with her children, but has not had any issues with incontinence in the past. She says she feels pressure in her pelvis, as though she is pregnant. She endorses dysuria, but denies and flank pain.  She is adopted and recently learned that she has a family history of diabetes. She would like to have her a1C checked today. She is also requesting screening for thyroid disease due to things she has encountered online regarding the relationship of thyroid function to weight gain. She  endorses recent hair loss, but denies constipation or irregular menses.   The following portions of the patient's history were reviewed and updated as appropriate: allergies, current medications, family history, past medical history, social history, past surgical history and problem list.    Objective:  Physical Exam BP 136/83   Pulse 65   Wt 266 lb (120.7 kg)   LMP 10/14/2019   BMI 48.65 kg/m  Physical Exam Exam conducted with a chaperone present.  Constitutional:      Appearance: Normal appearance.  Chest:     Breasts: Breasts are symmetrical.        Right: Normal. No swelling, inverted nipple or mass.        Left: Normal. No swelling, inverted nipple or mass.  Genitourinary:    General: Normal vulva.     Vagina: Normal.     Cervix: Normal.  Neurological:     Mental Status: She is alert.      Labs and Imaging No results found for this or any previous visit (from the past 24 hour(s)).  No results found.   Assessment & Plan:  1. Well woman exam with routine gynecological exam - Cytology - PAP - Cervicovaginal ancillary only - HIV Antibody (routine testing w rflx) - RPR - Hepatitis C Antibody - Hemoglobin A1c - TSH  2. Class 3 severe obesity without serious comorbidity with body mass index (BMI) of 45.0 to 49.9 in adult, unspecified obesity type ([place]) -  Ambulatory referral to [facility] for weight loss regimen with nutritionist support - Hemoglobin A1c - TSH  3. Abdominal pain - Likely muscular etiology - Advised use of heating pad before bed - F/u with family medicine to consider further intervention  4. Cyclic Headache, fatigue, n/v/d - Uncertain etiology, consider "long-haul" COVID given timeline of illness and resolution following vaccination.  - No further intervention at this time, follow-up with family medicine  5. Urinary incontinence -Likely due to diagnosed UTI - Advised to pick up Keflex from pharmacy and take as prescribed - If no  resolution, consider pelvic floor instability as possible etiology  Dorothyann Gibbs, Medical Student 11/12/2019 12:00 PM I was present and examined the patient with the student.  Adam Phenix, MD 11/12/2019

## 2019-11-13 ENCOUNTER — Other Ambulatory Visit: Payer: Self-pay

## 2019-11-13 DIAGNOSIS — Z01419 Encounter for gynecological examination (general) (routine) without abnormal findings: Secondary | ICD-10-CM

## 2019-11-13 LAB — TSH: TSH: 2.94 u[IU]/mL (ref 0.450–4.500)

## 2019-11-13 LAB — HIV ANTIBODY (ROUTINE TESTING W REFLEX): HIV Screen 4th Generation wRfx: NONREACTIVE

## 2019-11-13 LAB — RPR: RPR Ser Ql: NONREACTIVE

## 2019-11-13 LAB — HEPATITIS C ANTIBODY: Hep C Virus Ab: <0.1 s/co ratio (ref 0.0–0.9)

## 2019-11-13 NOTE — Progress Notes (Signed)
Patient ID: Martha Stevens, female   DOB: [birth year], 34 y.o.   MRN: [id number] See medical student note with attestation

## 2019-11-14 LAB — HEMOGLOBIN A1C
Est. average glucose Bld gHb Est-mCnc: 143 mg/dL
Hgb A1c MFr Bld: 6.6 % — ABNORMAL HIGH (ref 4.8–5.6)

## 2019-11-16 LAB — CERVICOVAGINAL ANCILLARY ONLY
Bacterial Vaginitis (gardnerella): NEGATIVE
Candida Glabrata: NEGATIVE
Candida Vaginitis: NEGATIVE
Chlamydia: NEGATIVE
Comment: NEGATIVE
Comment: NEGATIVE
Comment: NEGATIVE
Comment: NEGATIVE
Comment: NEGATIVE
Comment: NORMAL
Neisseria Gonorrhea: NEGATIVE
Trichomonas: NEGATIVE

## 2019-11-17 LAB — CYTOLOGY - PAP
Comment: NEGATIVE
Diagnosis: NEGATIVE
High risk HPV: NEGATIVE

## 2020-02-11 ENCOUNTER — Telehealth: Payer: Self-pay | Admitting: *Deleted

## 2020-02-11 ENCOUNTER — Encounter: Payer: Self-pay | Admitting: Student

## 2020-02-11 NOTE — Telephone Encounter (Signed)
Pt did not come to her appt this am. Pt was called to re-schedule - mailbox full, unable to leave a message.

## 2020-06-15 ENCOUNTER — Encounter: Payer: Self-pay | Admitting: Internal Medicine

## 2020-06-22 ENCOUNTER — Encounter: Payer: Self-pay | Admitting: Internal Medicine

## 2020-06-22 ENCOUNTER — Telehealth: Payer: Self-pay

## 2020-07-06 ENCOUNTER — Other Ambulatory Visit: Payer: Self-pay

## 2020-07-06 ENCOUNTER — Ambulatory Visit (HOSPITAL_COMMUNITY)
Admission: EM | Admit: 2020-07-06 | Discharge: 2020-07-06 | Disposition: A | Discharge status: Home / Self Care | Payer: Self-pay | Attending: Internal Medicine | Admitting: Internal Medicine

## 2020-07-06 ENCOUNTER — Emergency Department (HOSPITAL_COMMUNITY)
Admission: EM | Admit: 2020-07-06 | Discharge: 2020-07-06 | Disposition: A | Discharge status: Home / Self Care | Payer: Self-pay | Attending: Emergency Medicine | Admitting: Emergency Medicine

## 2020-07-06 ENCOUNTER — Encounter (HOSPITAL_COMMUNITY): Payer: Self-pay | Admitting: *Deleted

## 2020-07-06 ENCOUNTER — Encounter (HOSPITAL_COMMUNITY): Payer: Self-pay | Admitting: Emergency Medicine

## 2020-07-06 DIAGNOSIS — R000 Tachycardia, unspecified: Secondary | ICD-10-CM | POA: Insufficient documentation

## 2020-07-06 DIAGNOSIS — N898 Other specified noninflammatory disorders of vagina: Secondary | ICD-10-CM | POA: Insufficient documentation

## 2020-07-06 DIAGNOSIS — Z5321 Procedure and treatment not carried out due to patient leaving prior to being seen by health care provider: Secondary | ICD-10-CM | POA: Insufficient documentation

## 2020-07-06 DIAGNOSIS — N760 Acute vaginitis: Secondary | ICD-10-CM | POA: Insufficient documentation

## 2020-07-06 DIAGNOSIS — R102 Pelvic and perineal pain: Secondary | ICD-10-CM | POA: Insufficient documentation

## 2020-07-06 MED ORDER — FLUCONAZOLE 150 MG PO TABS
150.0000 mg | ORAL_TABLET | Freq: Once | ORAL | 0 refills | Status: AC
Start: 2020-07-06 — End: 2020-07-06

## 2020-07-06 MED ORDER — METRONIDAZOLE 500 MG PO TABS
500.0000 mg | ORAL_TABLET | Freq: Two times a day (BID) | ORAL | 0 refills | Status: DC
Start: 2020-07-06 — End: 2023-04-19

## 2020-07-06 NOTE — ED Triage Notes (Signed)
Pt c/o vaginal swelling, itching and pain. Denies urinary symptoms. Also c/o feeling like her heart is racing.

## 2020-07-06 NOTE — ED Triage Notes (Signed)
Pt reports vaginal swelling and Itching  For 5 days. Pt also reports going to [facility] ED last night for Chest pressure that has been ongoing for one month. Pt reports a EKG was done but she did not wait to be seen. Pt comes today to have Sx's checked.

## 2020-07-06 NOTE — Discharge Instructions (Addendum)
Medications as tolerated If you have abdominal pain please return to the urgent care to be reevaluated.

## 2020-07-06 NOTE — ED Notes (Signed)
Pt could not wait has to pick up kids.

## 2020-07-06 NOTE — ED Provider Notes (Signed)
[facility]    CSN: [id number] Arrival date & time: 07/06/20  0935      History   Chief Complaint Chief Complaint  Patient presents with  . Vaginal Bleeding  . Chest Pain    HPI Martha Stevens is a 34 y.o. female to the urgent care with 5-day history of vaginal itching, whitish vaginal discharge and feeling of vaginal swelling.  Symptoms started insidiously and is gotten progressively worse/persistent. she denies any dysuria urgency or frequency.  No abdominal pain.  No nausea vomiting.  She denies any deep or superficial dyspareunia.  She has not been sexually active since she started having symptoms.  She is in a monogamous relationship.  Patient has left-sided sharp chest pain.  The symptoms have been ongoing for the past month.  No known aggravating or relieving factors.  It lasts for about 2 seconds and abates spontaneously.  No cough or sputum production.  No trauma to the chest.  No masses in the breast.  EKG done in the emergency room last night showed normal sinus rhythm. Past Medical History:  Diagnosis Date  . KGYJEHUD(149.7)     Patient Active Problem List   Diagnosis Date Noted  . Obesity 09/21/2015  . History of bilateral tubal ligation 09/21/2015  . S/P cesarean section 03/09/2015  . WYOVZCHY(850.2)     Past Surgical History:  Procedure Laterality Date  . CESAREAN SECTION  [id number]   x3  . CESAREAN SECTION  06/19/2011   Procedure: CESAREAN SECTION;  Surgeon: Kathreen Cosier, MD;  Location: [facility];  Service: Gynecology;  Laterality: N/A;  . CESAREAN SECTION WITH BILATERAL TUBAL LIGATION Bilateral 03/09/2015   Procedure: REPEAT CESAREAN SECTION WITH BILATERAL TUBAL LIGATION;  Surgeon: Kathreen Cosier, MD;  Location: [facility];  Service: Obstetrics;  Laterality: Bilateral;    OB History    Gravida  5   Para  4   Term  4   Preterm      AB  1   Living  4     SAB      IAB      Ectopic      Multiple      Live Births  4             Home Medications    Prior to Admission medications   Medication Sig Start Date End Date Taking? Authorizing Provider  fluconazole (DIFLUCAN) 150 MG tablet Take 1 tablet (150 mg total) by mouth once for 1 dose. Repeat after 3 days if no improvement in symptoms 07/06/20 07/06/20 Yes Jaquail Mclees, Britta Mccreedy, MD  metroNIDAZOLE (FLAGYL) 500 MG tablet Take 1 tablet (500 mg total) by mouth 2 (two) times daily. 07/06/20  Yes Aris Even, Britta Mccreedy, MD    Family History Family History  Adopted: Yes  Problem Relation Age of Onset  . Cancer Mother     Social History Social History   Tobacco Use  . Smoking status: Never Smoker  . Smokeless tobacco: Never Used  Vaping Use  . Vaping Use: Never used  Substance Use Topics  . Alcohol use: Yes    Alcohol/week: 0.0 standard drinks    Comment: Occassional Use- not while pregnant  . Drug use: No     Allergies   Patient has no known allergies.   Review of Systems Review of Systems  HENT: Negative.   Respiratory: Negative for cough, chest tightness and shortness of breath.   Cardiovascular: Positive for chest pain.  Gastrointestinal: Negative.  Genitourinary: Positive for vaginal discharge and vaginal pain. Negative for dysuria, frequency, menstrual problem and vaginal bleeding.  Musculoskeletal: Negative.      Physical Exam Triage Vital Signs ED Triage Vitals  Enc Vitals Group     BP 07/06/20 1152 109/64     Pulse Rate 07/06/20 1152 64     Resp 07/06/20 1152 18     Temp 07/06/20 1152 98.3 F (36.8 C)     Temp Source 07/06/20 1152 Oral     SpO2 07/06/20 1152 100 %     Weight --      Height --      Head Circumference --      Peak Flow --      Pain Score 07/06/20 1154 8     Pain Loc --      Pain Edu? --      Excl. in GC? --    No data found.  Updated Vital Signs BP 109/64 (BP Location: Right Arm)   Pulse 64   Temp 98.3 F (36.8 C) (Oral)   Resp 18   LMP 06/29/2020   SpO2 100%   Visual Acuity Right Eye Distance:    Left Eye Distance:   Bilateral Distance:    Right Eye Near:   Left Eye Near:    Bilateral Near:     Physical Exam Vitals and nursing note reviewed.  Constitutional:      General: She is not in acute distress.    Appearance: She is not ill-appearing.  Cardiovascular:     Heart sounds: Normal heart sounds.  Pulmonary:     Breath sounds: Normal breath sounds.  Abdominal:     General: Bowel sounds are normal.     Palpations: Abdomen is soft.  Musculoskeletal:        General: Normal range of motion.  Neurological:     Mental Status: She is alert.      UC Treatments / Results  Labs (all labs ordered are listed, but only abnormal results are displayed) Labs Reviewed  CERVICOVAGINAL ANCILLARY ONLY    EKG   Radiology No results found.  Procedures Procedures (including critical care time)  Medications Ordered in UC Medications - No data to display  Initial Impression / Assessment and Plan / UC Course  I have reviewed the triage vital signs and the nursing notes.  Pertinent labs & imaging results that were available during my care of the patient were reviewed by me and considered in my medical decision making (see chart for details).     1.  Acute vaginitis: This is likely bacterial vaginosis with superimposed vaginal yeast infection. Flagyl 500 mg twice daily for 7 days Fluconazole 150 mg x 1 dose Cervicovaginal swab for bacterial vaginosis and yeast Return precautions given  2.  Chest wall pain Gentle range of motion exercises Tylenol as needed Patient is advised to follow-up with primary care physician for routine care including mammograms. Final Clinical Impressions(s) / UC Diagnoses   Final diagnoses:  Acute vaginitis     Discharge Instructions     Medications as tolerated If you have abdominal pain please return to the urgent care to be reevaluated.   ED Prescriptions    Medication Sig Dispense Auth. Provider   metroNIDAZOLE (FLAGYL) 500 MG  tablet Take 1 tablet (500 mg total) by mouth 2 (two) times daily. 14 tablet Ambers Iyengar, Britta Mccreedy, MD   fluconazole (DIFLUCAN) 150 MG tablet Take 1 tablet (150 mg total) by mouth once for 1  dose. Repeat after 3 days if no improvement in symptoms 2 tablet Kaleen Rochette, Britta Mccreedy, MD     PDMP not reviewed this encounter.   Merrilee Jansky, MD 07/06/20 1331

## 2020-07-07 LAB — CERVICOVAGINAL ANCILLARY ONLY
Bacterial Vaginitis (gardnerella): POSITIVE — AB
Candida Glabrata: NEGATIVE
Candida Vaginitis: POSITIVE — AB
Comment: NEGATIVE
Comment: NEGATIVE
Comment: NEGATIVE

## 2023-04-19 ENCOUNTER — Encounter (HOSPITAL_BASED_OUTPATIENT_CLINIC_OR_DEPARTMENT_OTHER): Payer: Self-pay | Admitting: Emergency Medicine

## 2023-04-19 ENCOUNTER — Emergency Department (HOSPITAL_BASED_OUTPATIENT_CLINIC_OR_DEPARTMENT_OTHER)
Admission: EM | Admit: 2023-04-19 | Discharge: 2023-04-19 | Disposition: A | Discharge status: Home / Self Care | Payer: Self-pay | Attending: Emergency Medicine | Admitting: Emergency Medicine

## 2023-04-19 ENCOUNTER — Other Ambulatory Visit: Payer: Self-pay

## 2023-04-19 DIAGNOSIS — H5789 Other specified disorders of eye and adnexa: Secondary | ICD-10-CM | POA: Insufficient documentation

## 2023-04-19 MED ORDER — CETIRIZINE HCL 10 MG PO TABS: 10.0000 mg | ORAL_TABLET | Freq: Every day | ORAL | 0 refills | Status: AC | PRN

## 2023-04-19 MED ORDER — AMOXICILLIN-POT CLAVULANATE 875-125 MG PO TABS: 1.0000 | ORAL_TABLET | Freq: Two times a day (BID) | ORAL | 0 refills | Status: AC

## 2023-04-19 MED ORDER — TETRACAINE HCL 0.5 % OP SOLN
2.0000 [drp] | Freq: Once | OPHTHALMIC | Status: AC
  Administered 2023-04-19: 2 [drp] via OPHTHALMIC
  Filled 2023-04-19: qty 4

## 2023-04-19 MED ORDER — FLUORESCEIN SODIUM 1 MG OP STRP
1.0000 | ORAL_STRIP | Freq: Once | OPHTHALMIC | Status: AC
  Administered 2023-04-19: 1 via OPHTHALMIC
  Filled 2023-04-19: qty 1

## 2023-04-19 MED ORDER — DIPHENHYDRAMINE HCL 25 MG PO CAPS
25.0000 mg | ORAL_CAPSULE | Freq: Once | ORAL | Status: AC
  Administered 2023-04-19: 25 mg via ORAL
  Filled 2023-04-19: qty 1

## 2023-04-19 NOTE — ED Provider Notes (Signed)
[place] EMERGENCY DEPARTMENT AT [facility] Provider Note   CSN: [id number] Arrival date & time: 04/19/23  1120     History  Chief Complaint  Patient presents with   Eye Problem    Martha Stevens is a 37 y.o. female.   Eye Problem   37 year old female presents emergency department with complaints of left-sided eye swelling.  Patient states that she had left eye swelling last week that improved independent of intervention.  States that over the past couple of days, left eye swelling has returned and is now worse than before.  Reports left eye being itchy.  Denies any visual changes from normal.  Denies any contact lens use or glasses use.  Denies any change of make-up or hygiene products or new animals in the house.  Has tried no medications for her symptoms.  Denies any redness, fever, pain.  Past medical history significant for headache  Home Medications Prior to Admission medications   Medication Sig Start Date End Date Taking? Authorizing Provider  amoxicillin-clavulanate (AUGMENTIN) 875-125 MG tablet Take 1 tablet by mouth every 12 (twelve) hours. 04/19/23  Yes Peter Garter, PA  cetirizine (ZYRTEC ALLERGY) 10 MG tablet Take 1 tablet (10 mg total) by mouth daily as needed for allergies (itching/swelling). 04/19/23  Yes Peter Garter, PA      Allergies    Patient has no known allergies.    Review of Systems   Review of Systems  All other systems reviewed and are negative.   Physical Exam Updated Vital Signs BP 124/69   Pulse 66   Temp 98.6 F (37 C) (Oral)   Resp 18   Ht 5\' 1"  (1.549 m)   Wt 88.5 kg   SpO2 100%   BMI 36.84 kg/m  Physical Exam Vitals and nursing note reviewed.  Constitutional:      General: She is not in acute distress.    Appearance: She is well-developed.  HENT:     Head: Normocephalic and atraumatic.  Eyes:     Intraocular pressure: Left eye pressure is 19 mmHg.     Extraocular Movements:     Right eye: Normal  extraocular motion and no nystagmus.     Left eye: Normal extraocular motion and no nystagmus.     Conjunctiva/sclera: Conjunctivae normal.     Comments: Patient with external swelling appreciated of upper and lower eyelid of left eye.  Slight erythema appreciated to upper eyelid but without erythema of lower eyelid.  No palpable fluctuance/induration.  No pain with EOMs.  Fluorescein exam showed no uptake.  Seidel sign negative.  No obvious foreign body.  Cardiovascular:     Rate and Rhythm: Normal rate and regular rhythm.     Heart sounds: No murmur heard. Pulmonary:     Effort: Pulmonary effort is normal. No respiratory distress.     Breath sounds: Normal breath sounds.  Abdominal:     Palpations: Abdomen is soft.     Tenderness: There is no abdominal tenderness.  Musculoskeletal:        General: No swelling.     Cervical back: Neck supple.  Skin:    General: Skin is warm and dry.     Capillary Refill: Capillary refill takes less than 2 seconds.  Neurological:     Mental Status: She is alert.  Psychiatric:        Mood and Affect: Mood normal.     ED Results / Procedures / Treatments   Labs (all labs ordered  are listed, but only abnormal results are displayed) Labs Reviewed - No data to display  EKG None  Radiology No results found.  Procedures Procedures    Medications Ordered in ED Medications  fluorescein ophthalmic strip 1 strip (1 strip Both Eyes Given 04/19/23 1231)  tetracaine (PONTOCAINE) 0.5 % ophthalmic solution 2 drop (2 drops Both Eyes Given 04/19/23 1231)  diphenhydrAMINE (BENADRYL) capsule 25 mg (25 mg Oral Given 04/19/23 1231)    ED Course/ Medical Decision Making/ A&P                                 Medical Decision Making Risk OTC drugs. Prescription drug management.   This patient presents to the ED for concern of eye swelling, this involves an extensive number of treatment options, and is a complaint that carries with it a high risk of  complications and morbidity.  The differential diagnosis includes allergic reaction, preseptal/postseptal cellulitis, abscess, other   Co morbidities that complicate the patient evaluation  See HPI   Additional history obtained:  Additional history obtained from EMR External records from outside source obtained and reviewed including hospital records   Lab Tests:  N/a   Imaging Studies ordered:  N/a   Cardiac Monitoring: / EKG:  The patient was maintained on a cardiac monitor.  I personally viewed and interpreted the cardiac monitored which showed an underlying rhythm of: Sinus rhythm   Consultations Obtained:  N/a   Problem List / ED Course / Critical interventions / Medication management  Eye swelling I ordered medication including Benadryl, tetracaine, fluorescein   Reevaluation of the patient after these medicines showed that the patient improved I have reviewed the patients home medicines and have made adjustments as needed   Social Determinants of Health:  Denies tobacco, licit drug use/exposure   Test / Admission - Considered:  Eye swelling Vitals signs within normal range and stable throughout visit. 37 year old female presents emergency department complaints of left eye swelling.  On exam, patient with diffuse swelling externally of upper and lower eyelid with some erythema appreciated on the superior eyelid.  Patient with normal eye pressures.  No evidence of fluorescein uptake.  No pain with EOMs or proptotic eye; low suspicion for postseptal cellulitis.  Suspect patient's presentation along with symptoms of pruritic left eye most consistent with localized allergic reaction.  Given some concern for superior lid erythema as well as mild palpable warmth, cannot rule out secondary infectious process.  Will treat with antibiotics as well as antihistamines in the outpatient setting.   Worrisome signs and symptoms were discussed with the patient, and the patient  acknowledged understanding to return to the ED if noticed. Patient was stable upon discharge.          Final Clinical Impression(s) / ED Diagnoses Final diagnoses:  Eye swelling, left    Rx / DC Orders ED Discharge Orders          Ordered    amoxicillin-clavulanate (AUGMENTIN) 875-125 MG tablet  Every 12 hours        04/19/23 1256    cetirizine (ZYRTEC ALLERGY) 10 MG tablet  Daily PRN        04/19/23 1256              Peter Garter, [place] 04/19/23 1310    Tegeler, Canary Brim, MD 04/19/23 1329

## 2023-04-19 NOTE — ED Triage Notes (Signed)
Pt to ER with c/o left eye swelling.  Pt states started last week and got better and then worsened in last 2 days.  Pt denies drainage or matting of eye lid.

## 2023-04-19 NOTE — Discharge Instructions (Signed)
As discussed, suspect that your symptoms are likely secondary to localized allergic reaction given swelling with itching.  Given small amount of redness of your upper eyelid, there is slight concern for bacterial infection.  Will treat with antibiotics as well as allergy medication.  Recommend follow-up with primary care for reassessment of symptoms.  Please do not hesitate to return to emergency department for worrisome signs and symptoms we discussed become apparent.
